# Patient Record
Sex: Female | Born: 1965 | Race: White | Hispanic: No | Smoking: Former smoker
Health system: Southern US, Community
[De-identification: ages and names within clinical notes are randomized; demographics above are authoritative.]

## PROBLEM LIST (undated history)

## (undated) DIAGNOSIS — M722 Plantar fascial fibromatosis: Secondary | ICD-10-CM

## (undated) DIAGNOSIS — E785 Hyperlipidemia, unspecified: Secondary | ICD-10-CM

## (undated) HISTORY — PX: TUBAL LIGATION: SHX77

---

## 2001-01-25 ENCOUNTER — Other Ambulatory Visit: Admission: RE | Admit: 2001-01-25 | Discharge: 2001-01-25 | Payer: Self-pay | Admitting: Family Medicine

## 2005-02-13 ENCOUNTER — Ambulatory Visit: Payer: Self-pay

## 2005-08-11 ENCOUNTER — Ambulatory Visit: Payer: Self-pay

## 2006-08-07 ENCOUNTER — Ambulatory Visit: Payer: Self-pay | Admitting: Internal Medicine

## 2007-01-28 ENCOUNTER — Ambulatory Visit: Payer: Self-pay | Admitting: Internal Medicine

## 2008-01-31 ENCOUNTER — Ambulatory Visit: Payer: Self-pay | Admitting: Internal Medicine

## 2009-02-01 ENCOUNTER — Ambulatory Visit: Payer: Self-pay | Admitting: Internal Medicine

## 2010-04-24 ENCOUNTER — Ambulatory Visit: Payer: Self-pay

## 2011-12-11 IMAGING — MG MAM DGTL SCREENING MAMMO W/CAD
1 series · 4 of 4 positions shown · non-contrast
Comparison: none

REASON FOR EXAM: SCR
COMMENTS:

PROCEDURE:     MAM - MAM DGTL SCREENING MAMMO W/CAD  - April 24, 2010  [DATE]
RESULT:       Comparison is made to a prior study dated 02/01/2009 and
12/13/2002.
The breasts demonstrate a heterogenous parenchymal pattern.  There is no new
mammographic evidence to suggest malignancy.

[R CC · right · 4 of 4 slices shown]
[im 1/4]
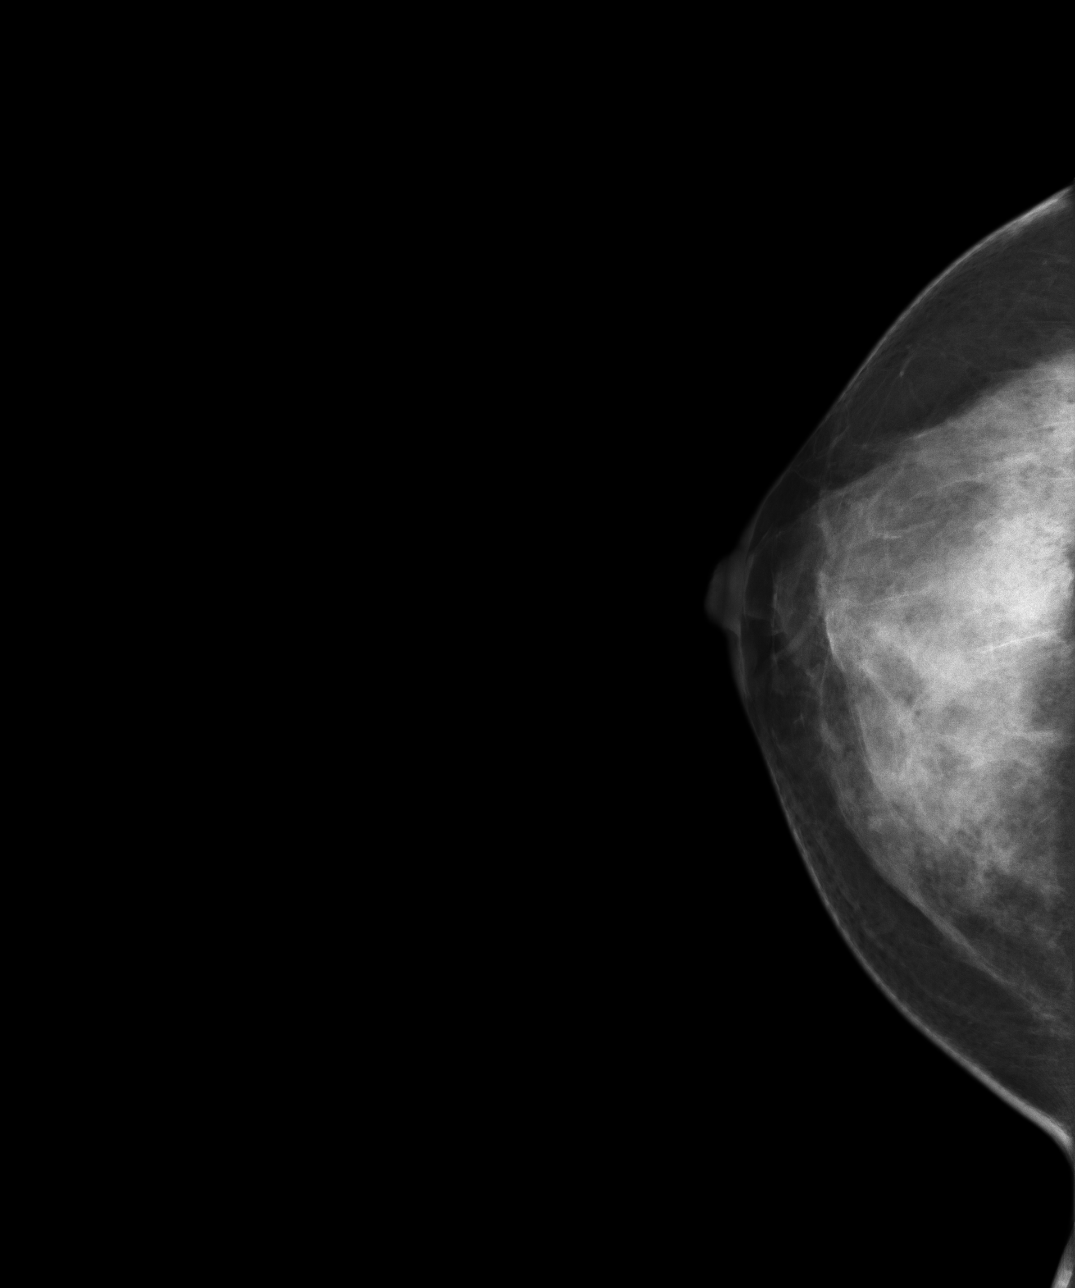
[im 2/4]
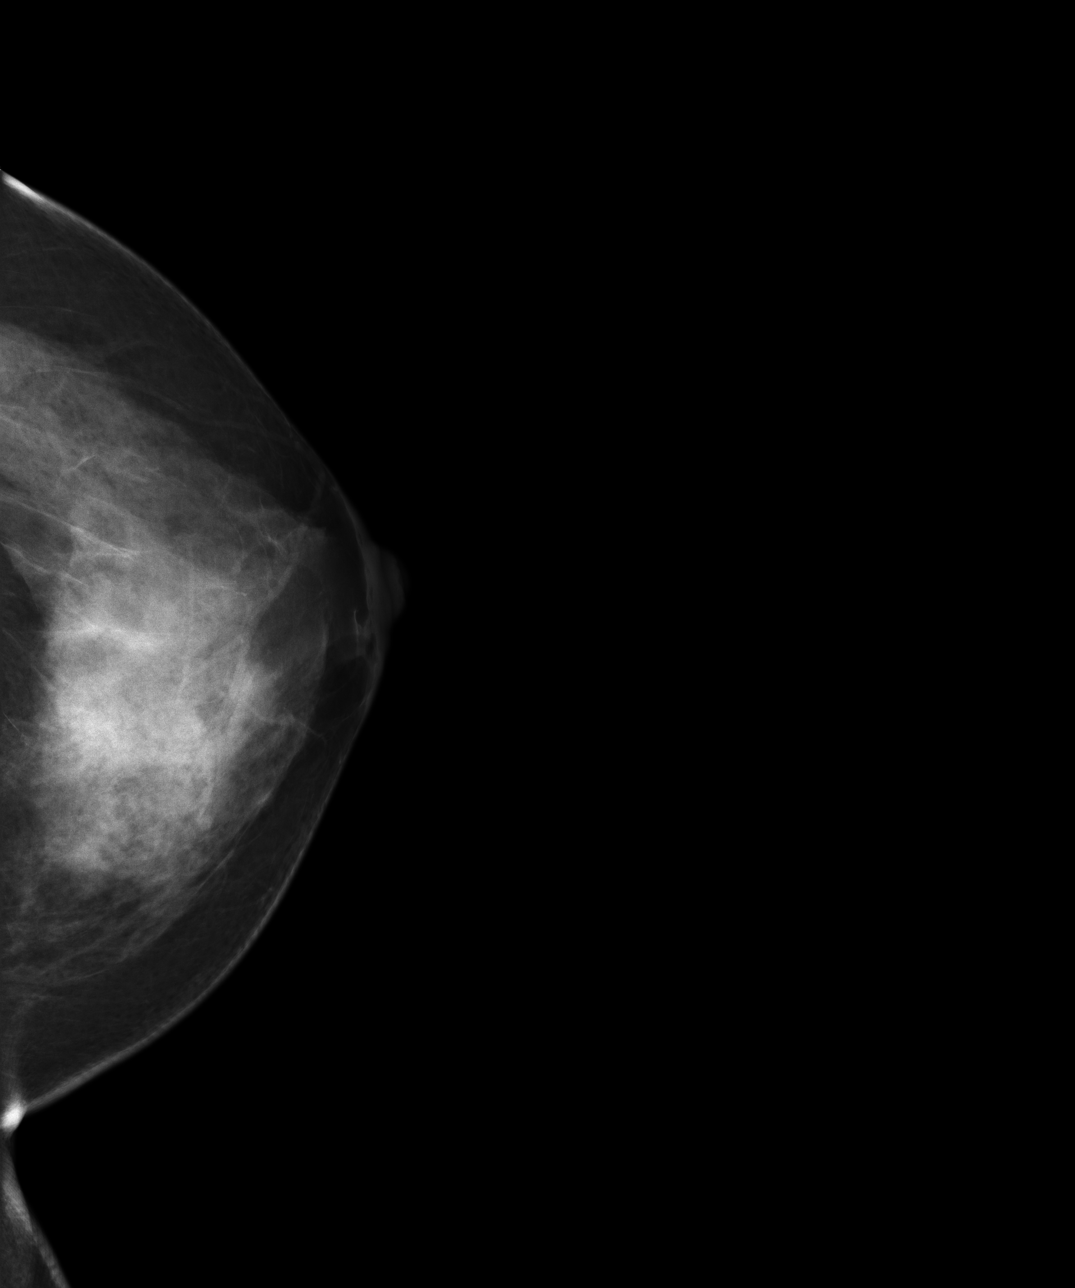
[im 3/4]
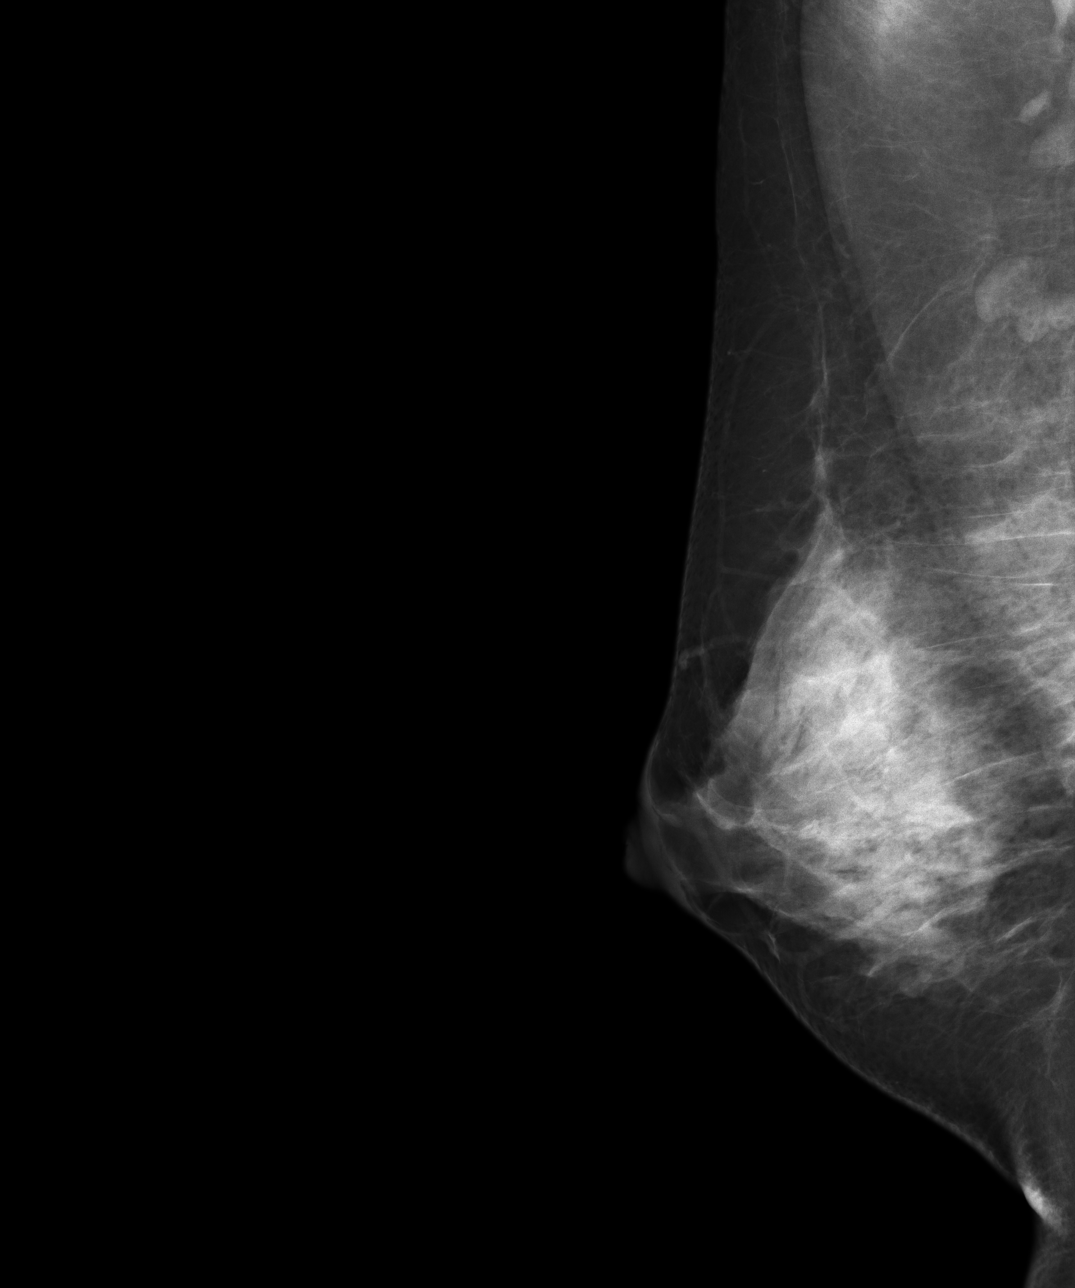
[im 4/4]
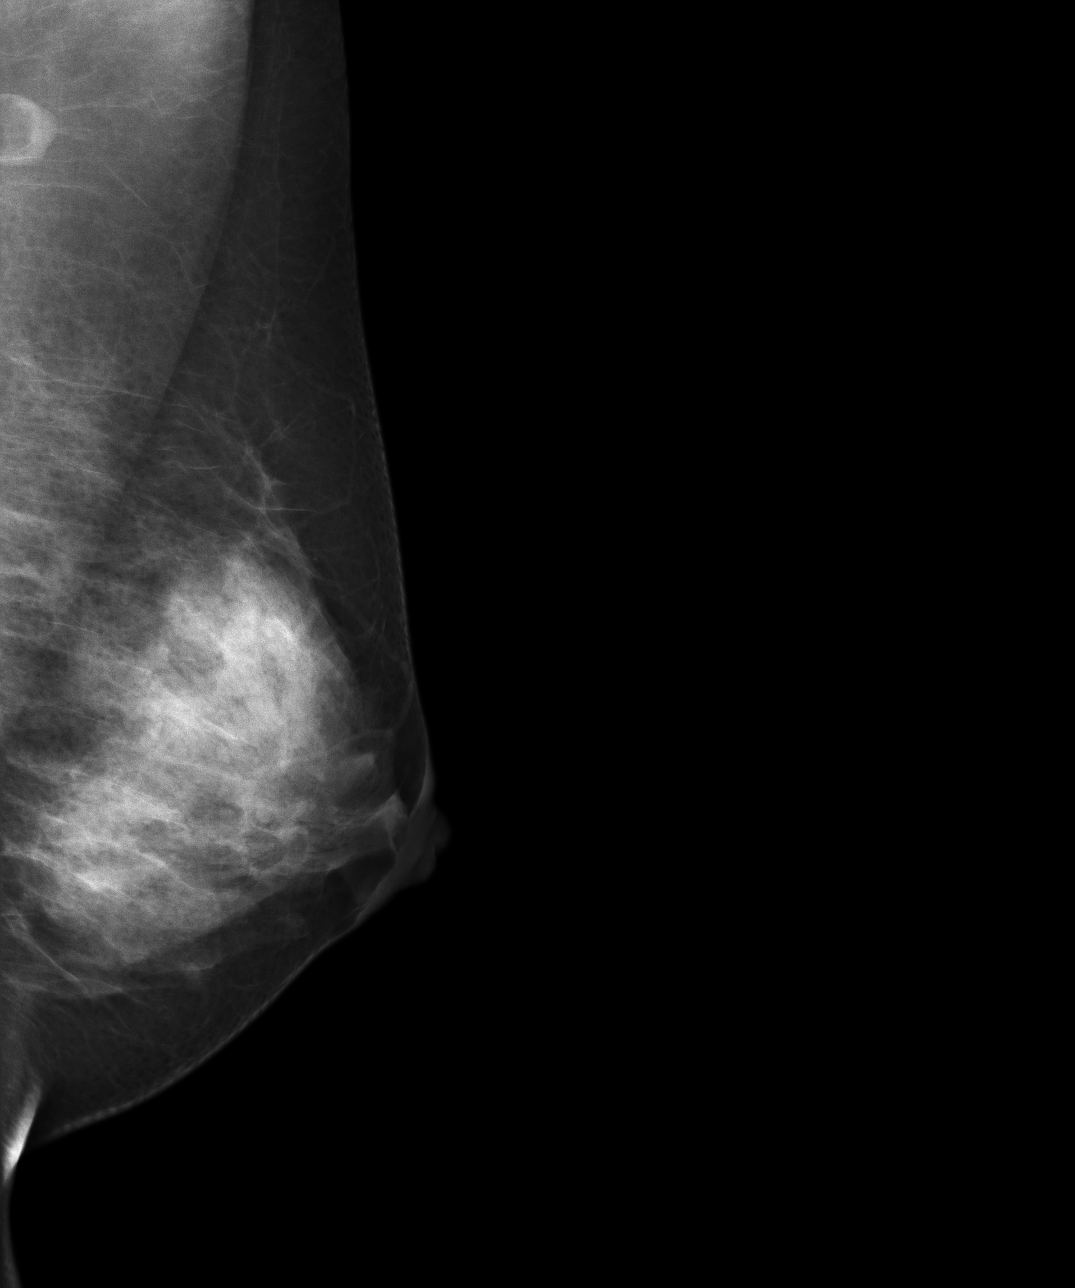

[4 of 4 positions shown; findings below may reference images not displayed]

IMPRESSION: BI-RADS:  Category 2- Benign Finding.

A negative mammogram report does not preclude biopsy or other evaluation of
a clinically palpable or otherwise suspicious mass or lesion. Breast cancer
may not be detected by mammography in up to 10% of cases.

## 2011-12-16 ENCOUNTER — Ambulatory Visit: Payer: Self-pay

## 2013-02-03 ENCOUNTER — Other Ambulatory Visit: Payer: Self-pay

## 2013-02-03 LAB — CBC WITH DIFFERENTIAL/PLATELET
Basophil #: 0 10*3/uL (ref 0.0–0.1)
Eosinophil #: 0.1 10*3/uL (ref 0.0–0.7)
Eosinophil %: 1.9 %
HCT: 39.2 % (ref 35.0–47.0)
Lymphocyte #: 1.6 10*3/uL (ref 1.0–3.6)
Lymphocyte %: 30.3 %
MCH: 29.9 pg (ref 26.0–34.0)
MCHC: 34.1 g/dL (ref 32.0–36.0)
MCV: 88 fL (ref 80–100)
Monocyte #: 0.5 x10 3/mm (ref 0.2–0.9)
Monocyte %: 8.5 %
Platelet: 279 10*3/uL (ref 150–440)
RBC: 4.47 10*6/uL (ref 3.80–5.20)
RDW: 12.7 % (ref 11.5–14.5)
WBC: 5.4 10*3/uL (ref 3.6–11.0)

## 2013-02-03 LAB — COMPREHENSIVE METABOLIC PANEL
Albumin: 4.2 g/dL (ref 3.4–5.0)
Alkaline Phosphatase: 98 U/L (ref 50–136)
Anion Gap: 4 — ABNORMAL LOW (ref 7–16)
Bilirubin,Total: 0.4 mg/dL (ref 0.2–1.0)
Calcium, Total: 9.1 mg/dL (ref 8.5–10.1)
Co2: 30 mmol/L (ref 21–32)
EGFR (African American): >60
EGFR (Non-African Amer.): >60
Glucose: 90 mg/dL (ref 65–99)
Osmolality: 275 (ref 275–301)
Potassium: 3.8 mmol/L (ref 3.5–5.1)
SGOT(AST): 24 U/L (ref 15–37)
Sodium: 138 mmol/L (ref 136–145)
Total Protein: 8 g/dL (ref 6.4–8.2)

## 2013-02-03 LAB — LIPID PANEL
Cholesterol: 244 mg/dL — ABNORMAL HIGH (ref 0–200)
Ldl Cholesterol, Calc: 148 mg/dL — ABNORMAL HIGH (ref 0–100)

## 2013-02-15 ENCOUNTER — Ambulatory Visit: Payer: Self-pay

## 2014-10-04 IMAGING — MG MAM DGTL SCRN MAM NO ORDER W/CAD
1 series · 4 of 4 positions shown · non-contrast
Comparison: Previous exam(s).

REASON FOR EXAM: SCR MAMMO NO ORDER
COMMENTS:

PROCEDURE:     MAM - MAM DGTL SCRN MAM NO ORDER W/CAD  - February 15, 2013  [DATE]
CLINICAL DATA: Screening.
EXAM:
DIGITAL SCREENING BILATERAL MAMMOGRAM WITH CAD

[R CC · right · 4 of 4 slices shown]
[im 1/4]
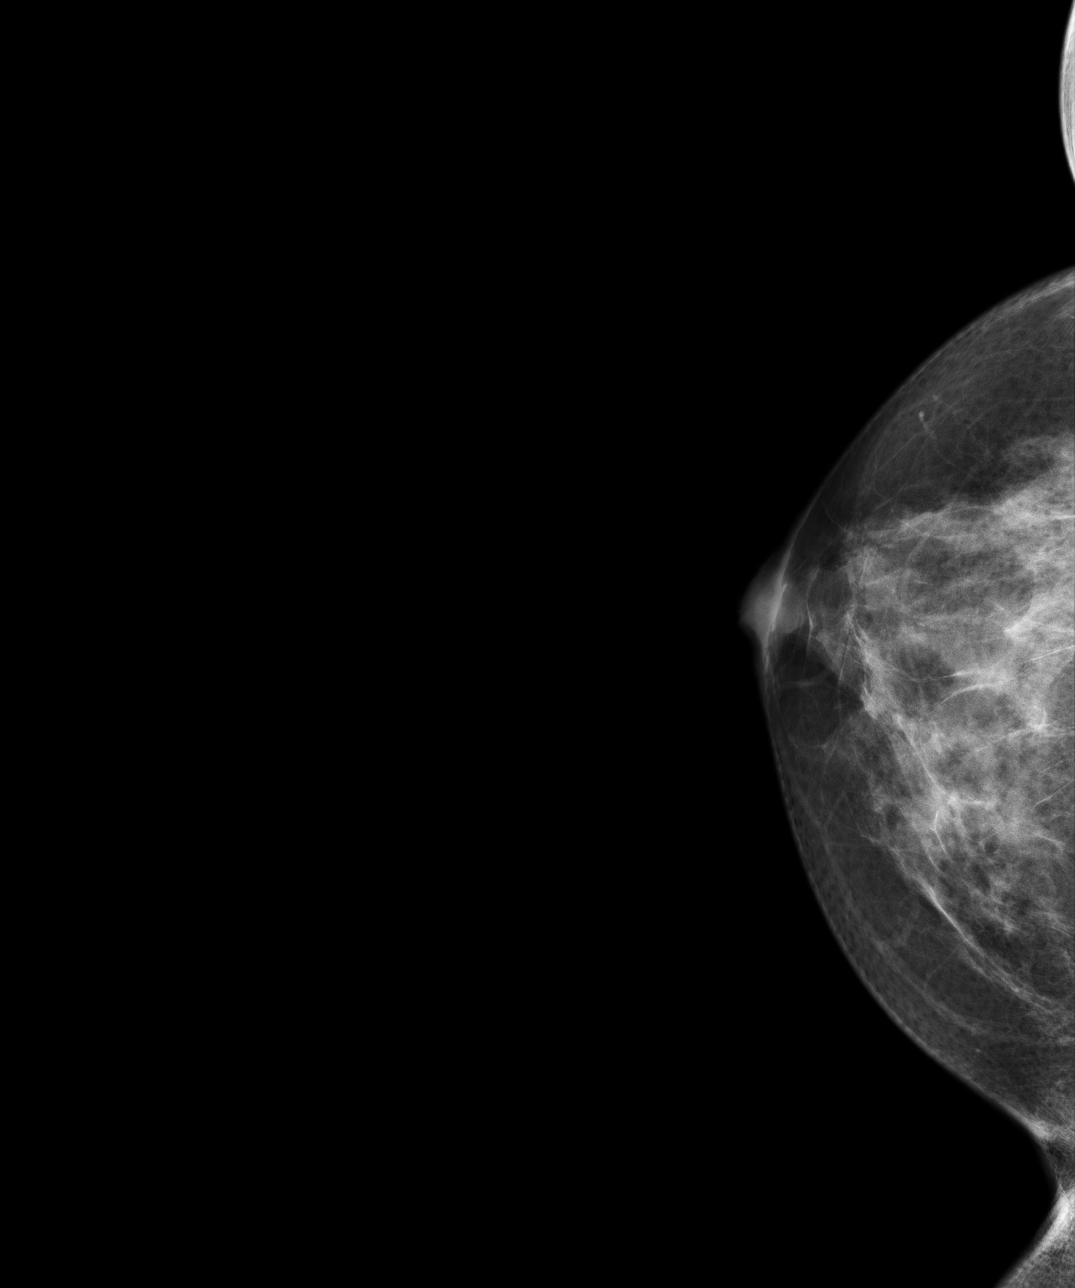
[im 2/4]
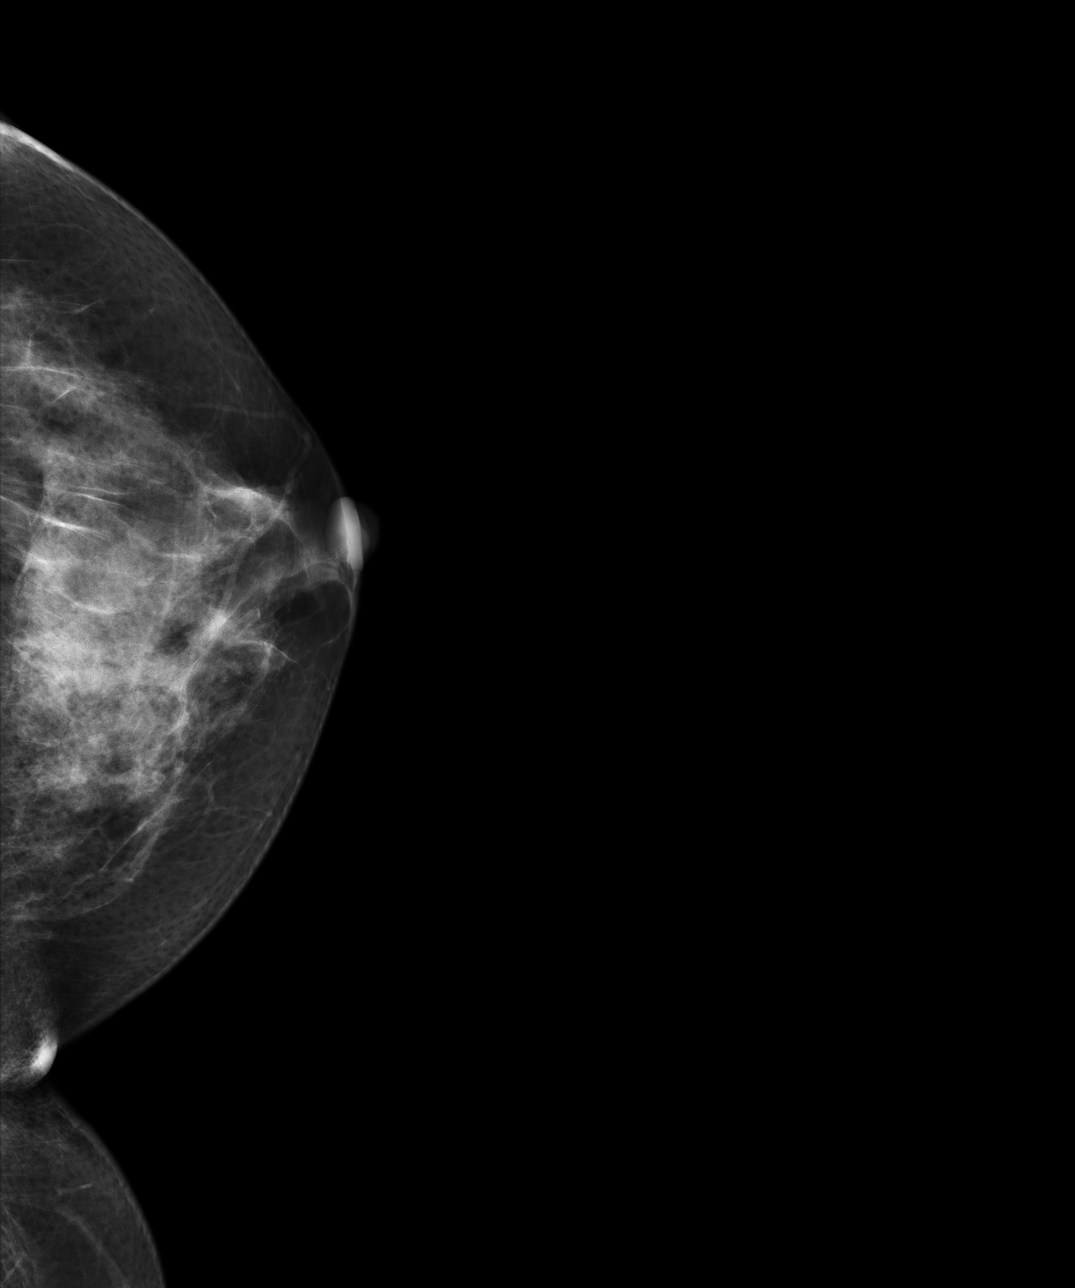
[im 3/4]
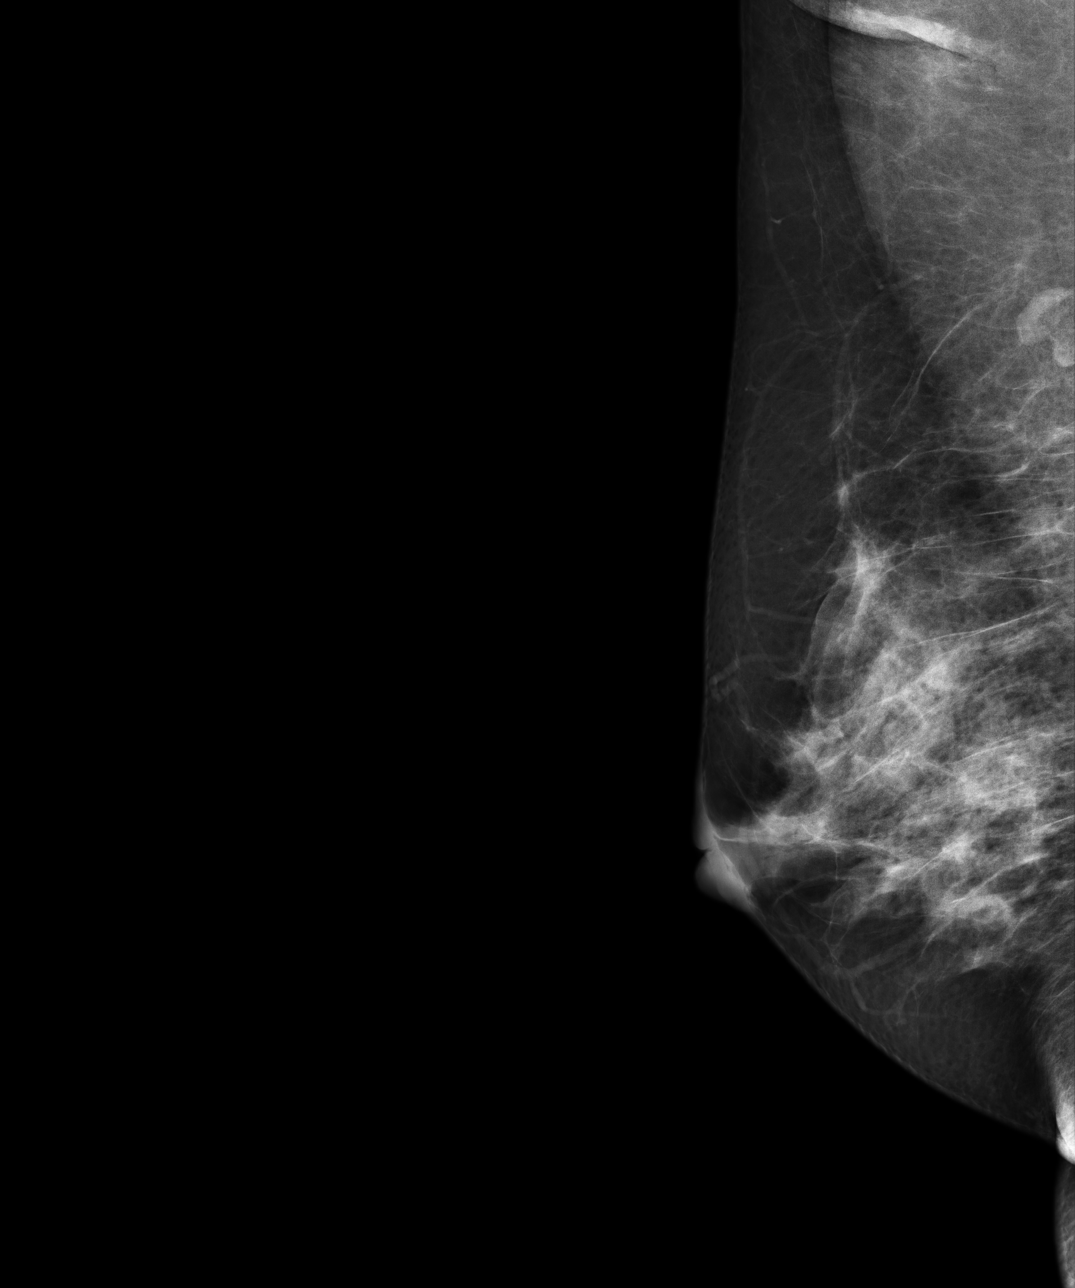
[im 4/4]
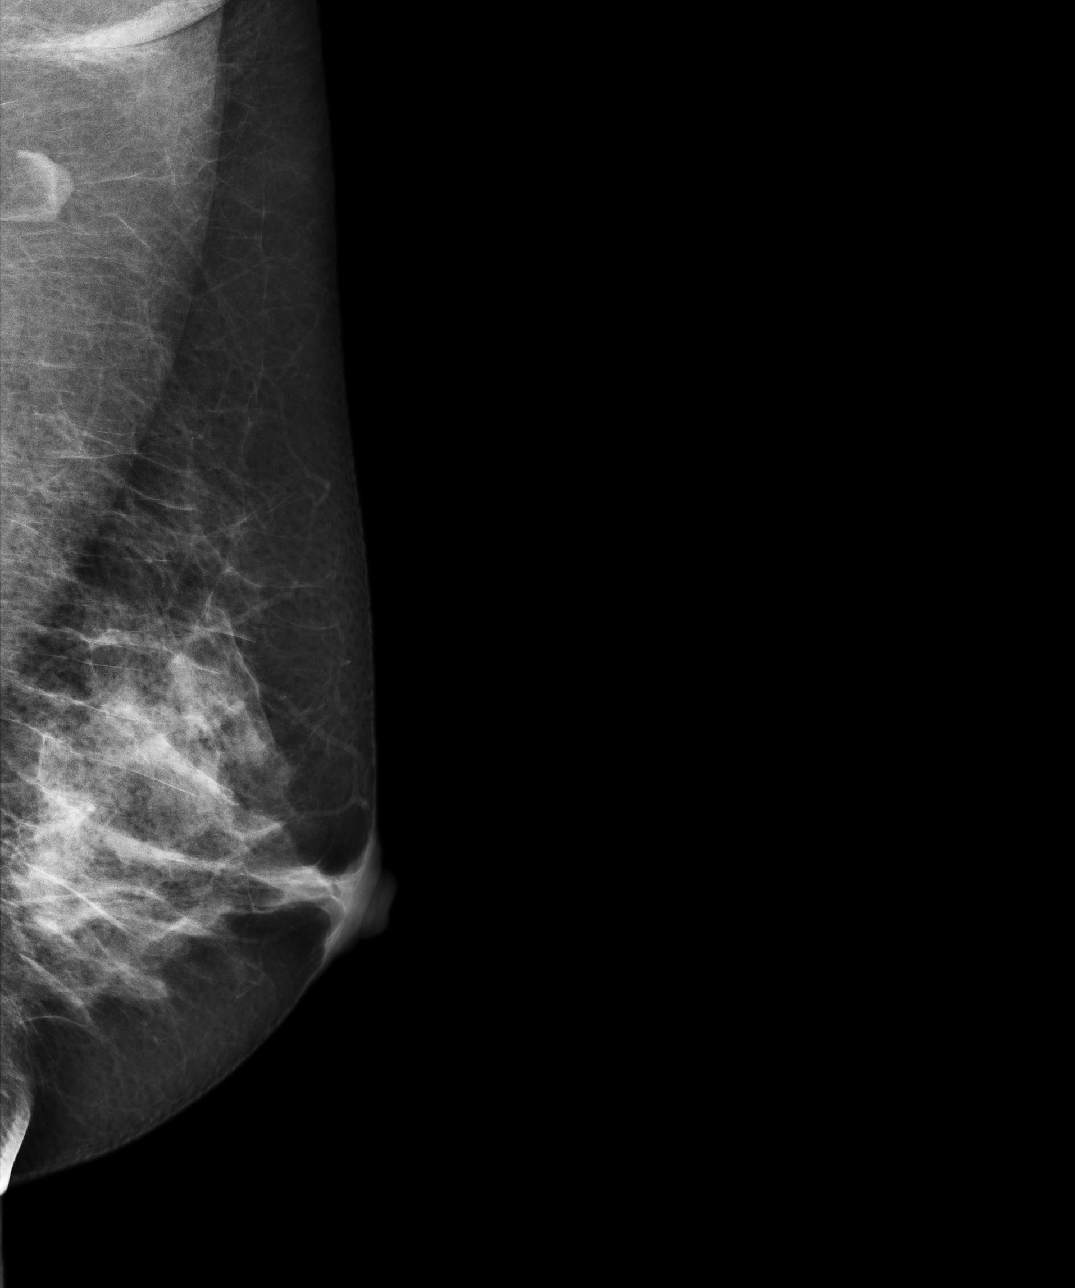

[4 of 4 positions shown; findings below may reference images not displayed]

ACR Breast Density Category c: The breasts are heterogeneously
dense, which may obscure small masses.
FINDINGS: There are no findings suspicious for malignancy. Images were
processed with CAD.
IMPRESSION: No mammographic evidence of malignancy. A result letter of this
screening mammogram will be mailed directly to the patient.

RECOMMENDATION:
Screening mammogram in one year. (Code:JR-3-VIF)

BI-RADS CATEGORY  1: Negative

## 2016-10-07 ENCOUNTER — Encounter: Payer: Self-pay | Admitting: Podiatry

## 2016-10-07 ENCOUNTER — Ambulatory Visit (INDEPENDENT_AMBULATORY_CARE_PROVIDER_SITE_OTHER): Payer: Managed Care, Other (non HMO)

## 2016-10-07 ENCOUNTER — Ambulatory Visit (INDEPENDENT_AMBULATORY_CARE_PROVIDER_SITE_OTHER): Payer: Managed Care, Other (non HMO) | Admitting: Podiatry

## 2016-10-07 DIAGNOSIS — M659 Synovitis and tenosynovitis, unspecified: Secondary | ICD-10-CM

## 2016-10-07 DIAGNOSIS — M79673 Pain in unspecified foot: Secondary | ICD-10-CM | POA: Diagnosis not present

## 2016-10-08 NOTE — Progress Notes (Signed)
   Subjective:  Patient presents today for aching, throbbing pain and tenderness to the medial side of the right ankle for the past 6 months. 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 Patient presents for further treatment and evaluation.   Objective / Physical Exam:  General:  The patient is alert and oriented x3 in no acute distress. Dermatology:  Skin is warm, dry and supple bilateral lower extremities. Negative for open lesions or macerations. Vascular:  Palpable pedal pulses bilaterally. No edema or erythema noted. Capillary refill within normal limits. Neurological:  Epicritic and protective threshold grossly intact bilaterally.  Musculoskeletal Exam:  Pain on palpation to the anterior lateral medial aspects of the patient's right ankle. Mild edema noted.  Range of motion within normal limits to all pedal and ankle joints bilateral. Muscle strength 5/5 in all groups bilateral.   Radiographic Exam:  Normal osseous mineralization. Joint spaces preserved. No fracture/dislocation/boney destruction.    Assessment: #1 pain in right ankle #2 synovitis of right ankle   Plan of Care:  #1 Patient was evaluated. X-rays reviewed. #2 injection of 0.5 mL Celestone Soluspan injected in the patient's right ankle. #3 right ankle brace dispensed. #4 continue taking OTC Motrin when necessary. #5 return to clinic in 4 weeks.  Felecia ShellingBrent M. Loys Shugars, DPM [facility]  Dr. Felecia ShellingBrent M. Dandrea Widdowson, DPM    [address]                                        [place], [place] [address]                Office [phone]  Fax [phone]

## 2016-10-25 MED ORDER — BETAMETHASONE SOD PHOS & ACET 6 (3-3) MG/ML IJ SUSP: 3.0000 mg | Freq: Once | INTRAMUSCULAR | Status: DC

## 2016-11-07 ENCOUNTER — Ambulatory Visit (INDEPENDENT_AMBULATORY_CARE_PROVIDER_SITE_OTHER): Payer: Managed Care, Other (non HMO) | Admitting: Podiatry

## 2016-11-07 DIAGNOSIS — M659 Synovitis and tenosynovitis, unspecified: Secondary | ICD-10-CM

## 2016-11-11 NOTE — Progress Notes (Signed)
   Subjective:  Patient presents today for follow up evaluation of right ankle pain. She states she is doing a lot better and is only having mild pain. Wearing the ankle brace and receiving the injection has helped alleviate the pain. She denies any new complaints at this time.    Objective / Physical Exam:  General:  The patient is alert and oriented x3 in no acute distress. Dermatology:  Skin is warm, dry and supple bilateral lower extremities. Negative for open lesions or macerations. Vascular:  Palpable pedal pulses bilaterally. No edema or erythema noted. Capillary refill within normal limits. Neurological:  Epicritic and protective threshold grossly intact bilaterally.  Musculoskeletal Exam:  Pain on palpation to the anterior lateral medial aspects of the patient's right ankle. Mild edema noted.  Range of motion within normal limits to all pedal and ankle joints bilateral. Muscle strength 5/5 in all groups bilateral.    Assessment: #1 pain in right ankle #2 synovitis of right ankle   Plan of Care:  #1 Patient was evaluated. #2 injection of 0.5 mL Celestone Soluspan injected in the patient's right ankle. #3 Continue wearing ankle brace and good shoe gear.  #4 Continue taking Motrin as needed. #5 Return to clinic when necessary.   Felecia ShellingBrent M. Bonnita Newby, DPM [facility]  Dr. Felecia ShellingBrent M. Wolfgang Finigan, DPM    [address]                                        [place], [place] [address]                Office [phone]  Fax [phone]

## 2016-11-14 MED ORDER — BETAMETHASONE SOD PHOS & ACET 6 (3-3) MG/ML IJ SUSP: 3.0000 mg | Freq: Once | INTRAMUSCULAR | Status: DC

## 2017-07-09 ENCOUNTER — Other Ambulatory Visit: Payer: Self-pay | Admitting: Family Medicine

## 2017-07-09 DIAGNOSIS — Z1231 Encounter for screening mammogram for malignant neoplasm of breast: Secondary | ICD-10-CM

## 2017-08-05 ENCOUNTER — Encounter: Payer: Self-pay | Admitting: Radiology

## 2017-08-05 ENCOUNTER — Ambulatory Visit
Admission: RE | Admit: 2017-08-05 | Discharge: 2017-08-05 | Disposition: A | Discharge status: Home / Self Care | Payer: Managed Care, Other (non HMO) | Source: Ambulatory Visit | Attending: Family Medicine | Admitting: Family Medicine

## 2017-08-05 DIAGNOSIS — Z1231 Encounter for screening mammogram for malignant neoplasm of breast: Secondary | ICD-10-CM | POA: Insufficient documentation

## 2017-09-07 LAB — HM COLONOSCOPY

## 2017-12-25 ENCOUNTER — Encounter: Payer: Self-pay | Admitting: Podiatry

## 2017-12-25 ENCOUNTER — Ambulatory Visit (INDEPENDENT_AMBULATORY_CARE_PROVIDER_SITE_OTHER): Payer: Managed Care, Other (non HMO)

## 2017-12-25 ENCOUNTER — Ambulatory Visit (INDEPENDENT_AMBULATORY_CARE_PROVIDER_SITE_OTHER): Payer: Managed Care, Other (non HMO) | Admitting: Podiatry

## 2017-12-25 DIAGNOSIS — M722 Plantar fascial fibromatosis: Secondary | ICD-10-CM

## 2017-12-25 MED ORDER — MELOXICAM 15 MG PO TABS: 15.0000 mg | ORAL_TABLET | Freq: Every day | ORAL | 1 refills | Status: AC

## 2017-12-25 MED ORDER — METHYLPREDNISOLONE 4 MG PO TBPK: ORAL_TABLET | ORAL | 0 refills | Status: DC

## 2017-12-27 NOTE — Progress Notes (Signed)
   Subjective: 52 year old female presenting today with a chief complaint of sharp, stabbing pain noted to the bilateral heels, left worse than right, that began about 6 months ago. She states it feels as if she is walking on rocks. She states the pain is worse after being on her feet all day at work and when she first gets out of the bed in the morning. She has been using inserts, stretching, icing and taking Ibuprofen, all with no significant relief. Patient presents today for further treatment and evaluation.  No past medical history on file.   Objective: Physical Exam General: The patient is alert and oriented x3 in no acute distress.  Dermatology: Skin is warm, dry and supple bilateral lower extremities. Negative for open lesions or macerations bilateral.   Vascular: Dorsalis Pedis and Posterior Tibial pulses palpable bilateral.  Capillary fill time is immediate to all digits.  Neurological: Epicritic and protective threshold intact bilateral.   Musculoskeletal: Tenderness to palpation to the plantar aspect of the bilateral heels along the plantar fascia. All other joints range of motion within normal limits bilateral. Strength 5/5 in all groups bilateral.   Radiographic exam: Normal osseous mineralization. Joint spaces preserved. No fracture/dislocation/boney destruction. No other soft tissue abnormalities or radiopaque foreign bodies.   Assessment: 1. plantar fasciitis bilateral feet 2. Right ankle synovitis - resolved   Plan of Care:  1. Patient evaluated. Xrays reviewed.   2. Injection of 0.5cc Celestone soluspan injected into the bilateral heels.  3. Rx for Medrol Dose Pak placed 4. Rx for Meloxicam ordered for patient. 5. Plantar fascial band(s) dispensed for bilateral plantar fasciitis. 6. Instructed patient regarding therapies and modalities at home to alleviate symptoms.  7. Return to clinic in 4 weeks.    Felecia ShellingBrent M. Evans, DPM [facility]  Dr. Felecia ShellingBrent M.  Evans, DPM    [address]                                   [place], [place] [address]                Office [phone]  Fax [phone]

## 2018-01-22 ENCOUNTER — Encounter

## 2018-01-22 ENCOUNTER — Ambulatory Visit (INDEPENDENT_AMBULATORY_CARE_PROVIDER_SITE_OTHER): Payer: Managed Care, Other (non HMO) | Admitting: Podiatry

## 2018-01-22 DIAGNOSIS — M722 Plantar fascial fibromatosis: Secondary | ICD-10-CM | POA: Diagnosis not present

## 2018-01-25 NOTE — Progress Notes (Signed)
   Subjective: 52 year old female presenting today for follow up evaluation of plantar fasciitis of bilateral feet. She reports the pain has improved but is still present when working. She has been wearing the fascial braces and taking Meloxicam for treatment. Patient is here for further evaluation and treatment.   No past medical history on file.   Objective: Physical Exam General: The patient is alert and oriented x3 in no acute distress.  Dermatology: Skin is warm, dry and supple bilateral lower extremities. Negative for open lesions or macerations bilateral.   Vascular: Dorsalis Pedis and Posterior Tibial pulses palpable bilateral.  Capillary fill time is immediate to all digits.  Neurological: Epicritic and protective threshold intact bilateral.   Musculoskeletal: Tenderness to palpation to the plantar aspect of the bilateral heels along the plantar fascia. All other joints range of motion within normal limits bilateral. Strength 5/5 in all groups bilateral.   Assessment: 1. plantar fasciitis bilateral feet  Plan of Care:  1. Patient evaluated.    2. Injection of 0.5cc Celestone soluspan injected into the bilateral heels.  3. Continue using plantar fascial braces and taking Meloxicam.  4. Continue using insoles from chiropractor.  5. Return to clinic as needed.   Works at [employer].    Felecia ShellingBrent M. Adriaan Maltese, DPM [facility]  Dr. Felecia ShellingBrent M. Zerick Prevette, DPM    [address]                                   [place], [place] [address]                Office [phone]  Fax [phone]

## 2018-07-14 ENCOUNTER — Encounter: Payer: Self-pay | Admitting: Emergency Medicine

## 2018-07-14 ENCOUNTER — Other Ambulatory Visit: Payer: Self-pay

## 2018-07-14 ENCOUNTER — Ambulatory Visit (INDEPENDENT_AMBULATORY_CARE_PROVIDER_SITE_OTHER): Payer: Worker's Compensation

## 2018-07-14 ENCOUNTER — Ambulatory Visit
Admission: EM | Admit: 2018-07-14 | Discharge: 2018-07-14 | Disposition: A | Discharge status: Home / Self Care | Payer: Worker's Compensation | Attending: Internal Medicine | Admitting: Internal Medicine

## 2018-07-14 DIAGNOSIS — M79642 Pain in left hand: Secondary | ICD-10-CM | POA: Diagnosis not present

## 2018-07-14 DIAGNOSIS — Z042 Encounter for examination and observation following work accident: Secondary | ICD-10-CM

## 2018-07-14 HISTORY — DX: Plantar fascial fibromatosis: M72.2

## 2018-07-14 NOTE — ED Provider Notes (Signed)
[facility]    CSN: [id number] Arrival date & time: 07/14/18  0945     History   Chief Complaint Chief Complaint  Patient presents with  . Hand Injury    W/C DOI 07/13/18 left    HPI Diana Rose is a 53 y.o. female.   This is a 53 year old female presents to care complaining of left forearm and hand pain.  The patient states that she was at work and lifting parts off an assembly line when she felt a sharp pain between her second and third digit that ran up her lateral forearm.  The sharp pain has subsided but she still has soreness and swelling of her forearm.  The interdigital space of her left hand is also tender to palpation.  She denies weakness or numbness in the hand.     Past Medical History:  Diagnosis Date  . Plantar fasciitis     There are no active problems to display for this patient.   Past Surgical History:  Procedure Laterality Date  . TUBAL LIGATION      OB History   No obstetric history on file.      Home Medications    Prior to Admission medications   Medication Sig Start Date End Date Taking? Authorizing Provider  escitalopram (LEXAPRO) 10 MG tablet  12/24/17   [provider]  methylPREDNISolone (MEDROL DOSEPAK) 4 MG TBPK tablet 6 day dose pack - take as directed 12/25/17   Felecia Shelling, DPM  Omega 3 1000 MG CAPS Take by mouth.    [provider]    Family History Family History  Problem Relation Age of Onset  . Fibromyalgia Mother   . Heart attack Mother        25s  . Hypertension Mother   . Other Father        unknown medical history    Social History Social History   Tobacco Use  . Smoking status: Current Some Day Smoker  . Smokeless tobacco: Never Used  Substance Use Topics  . Alcohol use: Yes    Comment: occassional  . Drug use: Never     Allergies   Asa [aspirin]   Review of Systems Review of Systems  Constitutional: Negative for chills and fever.  HENT: Negative for sore  throat and tinnitus.   Eyes: Negative for redness.  Respiratory: Negative for cough and shortness of breath.   Cardiovascular: Negative for chest pain and palpitations.  Gastrointestinal: Negative for abdominal pain, diarrhea, nausea and vomiting.  Genitourinary: Negative for dysuria, frequency and urgency.  Musculoskeletal: Negative for myalgias.  Skin: Negative for rash.       No lesions  Neurological: Negative for weakness.  Hematological: Does not bruise/bleed easily.  Psychiatric/Behavioral: Negative for suicidal ideas.     Physical Exam Triage Vital Signs ED Triage Vitals  Enc Vitals Group     BP 07/14/18 1017 128/82     Pulse Rate 07/14/18 1017 67     Resp 07/14/18 1017 16     Temp 07/14/18 1017 (!) 97.5 F (36.4 C)     Temp Source 07/14/18 1017 Oral     SpO2 07/14/18 1017 98 %     Weight 07/14/18 1017 160 lb (72.6 kg)     Height 07/14/18 1017 5' (1.524 m)     Head Circumference --      Peak Flow --      Pain Score 07/14/18 1016 4     Pain Loc --  Pain Edu? --      Excl. in GC? --    No data found.  Updated Vital Signs BP 128/82 (BP Location: Right Arm)   Pulse 67   Temp (!) 97.5 F (36.4 C) (Oral)   Resp 16   Ht 5' (1.524 m)   Wt 72.6 kg   SpO2 98%   BMI 31.25 kg/m   Visual Acuity Right Eye Distance:   Left Eye Distance:   Bilateral Distance:    Right Eye Near:   Left Eye Near:    Bilateral Near:     Physical Exam Vitals signs and nursing note reviewed.  Constitutional:      General: She is not in acute distress.    Appearance: She is well-developed.  HENT:     Head: Normocephalic and atraumatic.  Eyes:     General: No scleral icterus.    Conjunctiva/sclera: Conjunctivae normal.     Pupils: Pupils are equal, round, and reactive to light.  Neck:     Musculoskeletal: Normal range of motion and neck supple.     Thyroid: No thyromegaly.     Vascular: No JVD.     Trachea: No tracheal deviation.  Cardiovascular:     Rate and Rhythm:  Normal rate and regular rhythm.     Heart sounds: Normal heart sounds. No murmur. No friction rub. No gallop.   Pulmonary:     Effort: Pulmonary effort is normal.     Breath sounds: Normal breath sounds.  Abdominal:     General: Bowel sounds are normal. There is no distension.     Palpations: Abdomen is soft.     Tenderness: There is no abdominal tenderness.  Musculoskeletal: Normal range of motion.     Comments: Mild swelling of left forearm.  No bony abnormalities of the hand.  Grip strength normal.  Normal extensor range of motion. normal sensation  Lymphadenopathy:     Cervical: No cervical adenopathy.  Skin:    General: Skin is warm and dry.  Neurological:     Mental Status: She is alert and oriented to person, place, and time.     Cranial Nerves: No cranial nerve deficit.  Psychiatric:        Behavior: Behavior normal.        Thought Content: Thought content normal.        Judgment: Judgment normal.      UC Treatments / Results  Labs (all labs ordered are listed, but only abnormal results are displayed) Labs Reviewed - No data to display  EKG None  Radiology Dg Hand Complete Left  Result Date: 07/14/2018 CLINICAL DATA:  Pain between the second and third digits after repetitive injury last week. EXAM: LEFT HAND - COMPLETE 3+ VIEW COMPARISON:  None. FINDINGS: Generalized osteopenic appearance. No evidence of fracture or malalignment. No erosion or spurring. Lucency in the third proximal phalanx on the oblique view with benign appearance. IMPRESSION: No acute finding. Electronically Signed   By: Marnee SpringJonathon  Watts M.D.   On: 07/14/2018 11:10    Procedures Procedures (including critical care time)  Medications Ordered in UC Medications - No data to display  Initial Impression / Assessment and Plan / UC Course  I have reviewed the triage vital signs and the nursing notes.  Pertinent labs & imaging results that were available during my care of the patient were reviewed  by me and considered in my medical decision making (see chart for details).     X-ray negative for  fracture.  Seems more consistent with tenosynovitis.  Noninfectious.  Possibly avulsion of extensor tendon loops.  No compromise of range of motion or grip strength.  Arm splinted with IV board and Ace wrap to prevent hyperextension of fingers.  If forearm/brachioradialis soreness persists advised tendinitis band over forearm.  NSAID therapy and rest.  Worker's Compensation form completed.  Final Clinical Impressions(s) / UC Diagnoses   Final diagnoses:  Left hand pain   Discharge Instructions   None    ED Prescriptions    None     Controlled Substance Prescriptions [place] Controlled Substance Registry consulted? Not Applicable   Arnaldo Natal, MD 07/14/18 1139

## 2018-07-14 NOTE — ED Triage Notes (Signed)
Patient in today c/o left hand injury at work on 07/13/18. Patient states she was during her normal job and suddenly felt numbness in her left hand.

## 2019-07-13 DIAGNOSIS — G4726 Circadian rhythm sleep disorder, shift work type: Secondary | ICD-10-CM | POA: Diagnosis not present

## 2019-07-13 DIAGNOSIS — G4733 Obstructive sleep apnea (adult) (pediatric): Secondary | ICD-10-CM | POA: Diagnosis not present

## 2019-07-13 DIAGNOSIS — E669 Obesity, unspecified: Secondary | ICD-10-CM | POA: Diagnosis not present

## 2019-08-05 ENCOUNTER — Ambulatory Visit: Payer: Self-pay | Attending: Internal Medicine

## 2019-08-05 DIAGNOSIS — Z23 Encounter for immunization: Secondary | ICD-10-CM

## 2019-08-05 NOTE — Progress Notes (Signed)
   Covid-19 Vaccination Clinic  Name:  Diana Rose    MRN: [id number] DOB: 10/17/1965  08/05/2019  Diana Rose was observed post Covid-19 immunization for 15 minutes without incident. She was provided with Vaccine Information Sheet and instruction to access the V-Safe system.   Diana Rose was instructed to call 911 with any severe reactions post vaccine: Marland Kitchen Difficulty breathing  . Swelling of face and throat  . A fast heartbeat  . A bad rash all over body  . Dizziness and weakness   Immunizations Administered    Name Date Dose VIS Date Route   Pfizer COVID-19 Vaccine 08/05/2019  8:35 AM 0.3 mL 04/15/2019 Intramuscular   Manufacturer: [employer], [employer]   Lot: [id number]   NDC: [address]-8

## 2019-09-06 ENCOUNTER — Ambulatory Visit: Payer: Self-pay | Attending: Internal Medicine

## 2019-09-06 DIAGNOSIS — Z23 Encounter for immunization: Secondary | ICD-10-CM

## 2019-09-06 NOTE — Progress Notes (Signed)
   Covid-19 Vaccination Clinic  Name:  Diana Rose    MRN: [id number] DOB: 11/12/1965  09/06/2019  [name] was observed post Covid-19 immunization for 15 minutes without incident. She was provided with Vaccine Information Sheet and instruction to access the V-Safe system.   [name] was instructed to call 911 with any severe reactions post vaccine: Marland Kitchen Difficulty breathing  . Swelling of face and throat  . A fast heartbeat  . A bad rash all over body  . Dizziness and weakness   Immunizations Administered    Name Date Dose VIS Date Route   Pfizer COVID-19 Vaccine 09/06/2019  8:34 AM 0.3 mL 06/29/2018 Intramuscular   Manufacturer: [employer], [employer]   Lot: [id number]   NDC: [address]-4

## 2020-03-01 IMAGING — CR LEFT HAND - COMPLETE 3+ VIEW
3 series · 3 of 3 positions shown · non-contrast
Comparison: None.

CLINICAL DATA: Pain between the second and third digits after
repetitive injury last week.

EXAM:
LEFT HAND - COMPLETE 3+ VIEW

[hand ap]
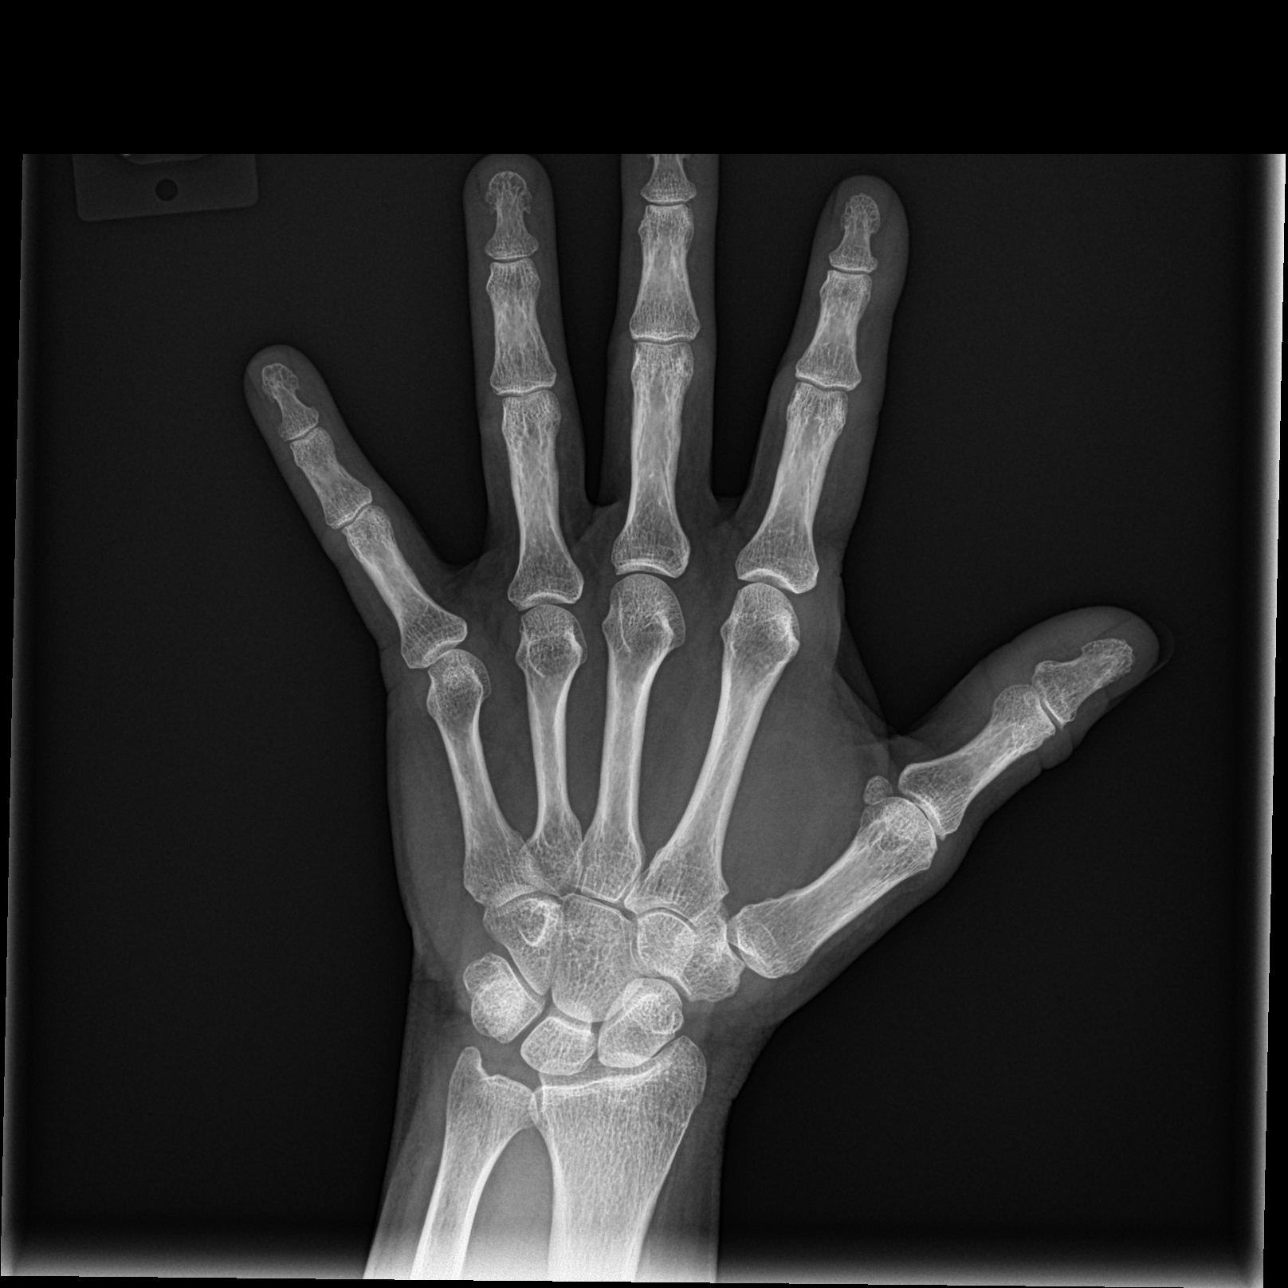

[hand obl]
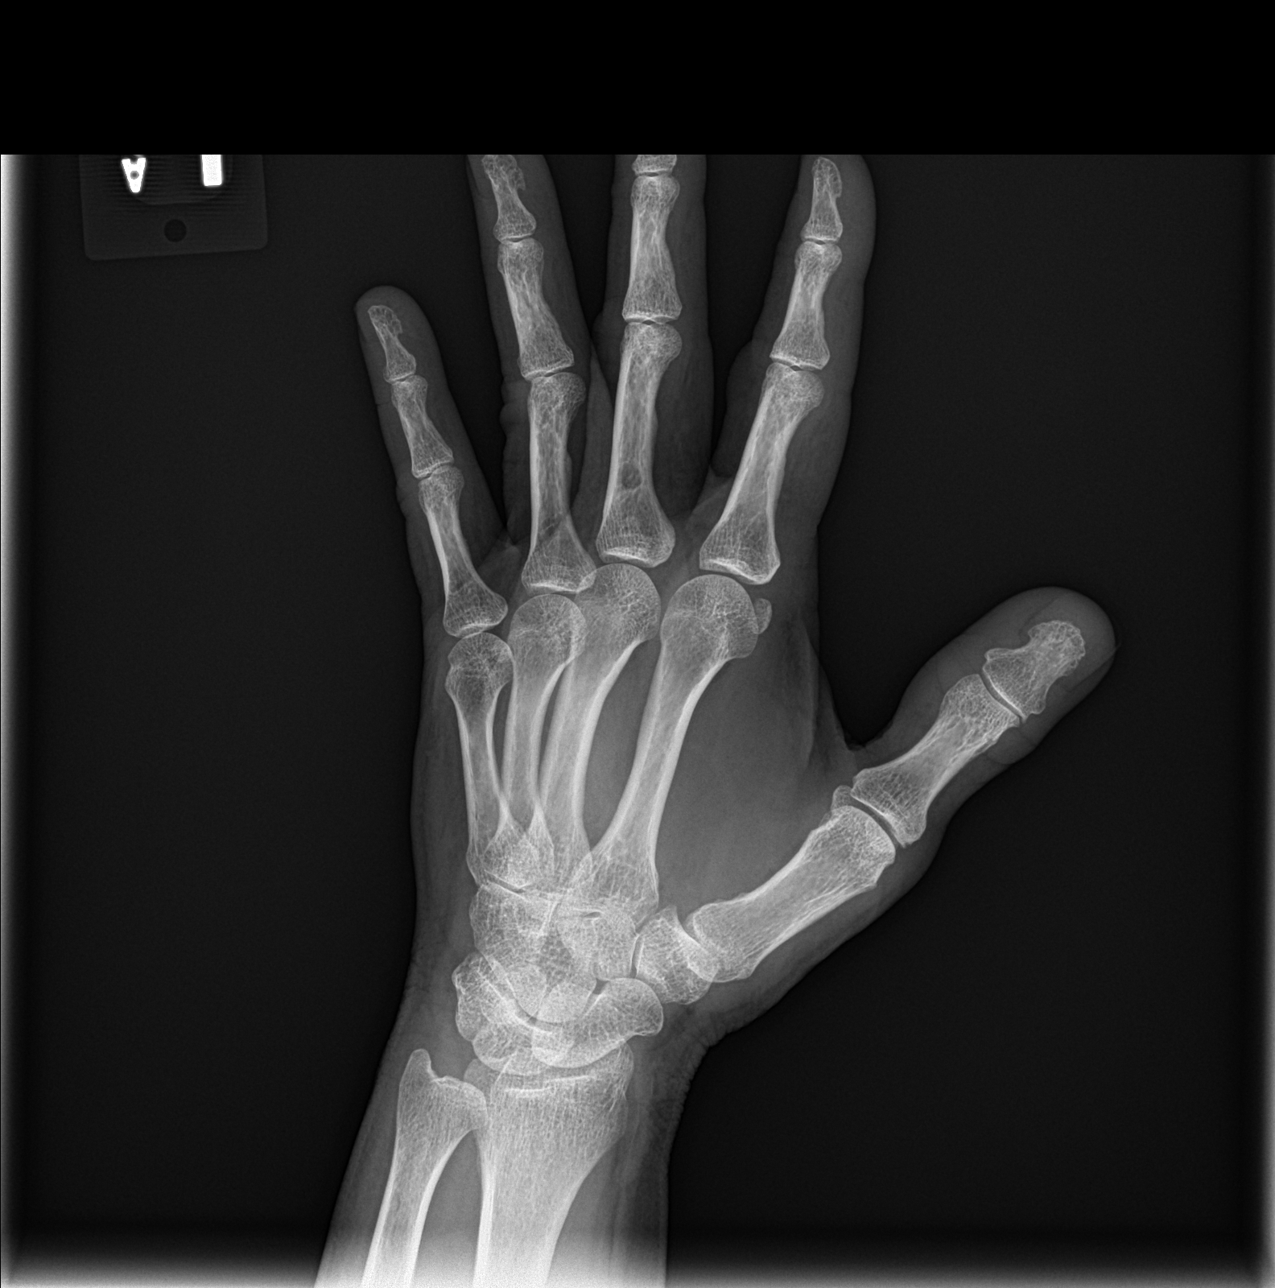

[hand lat]
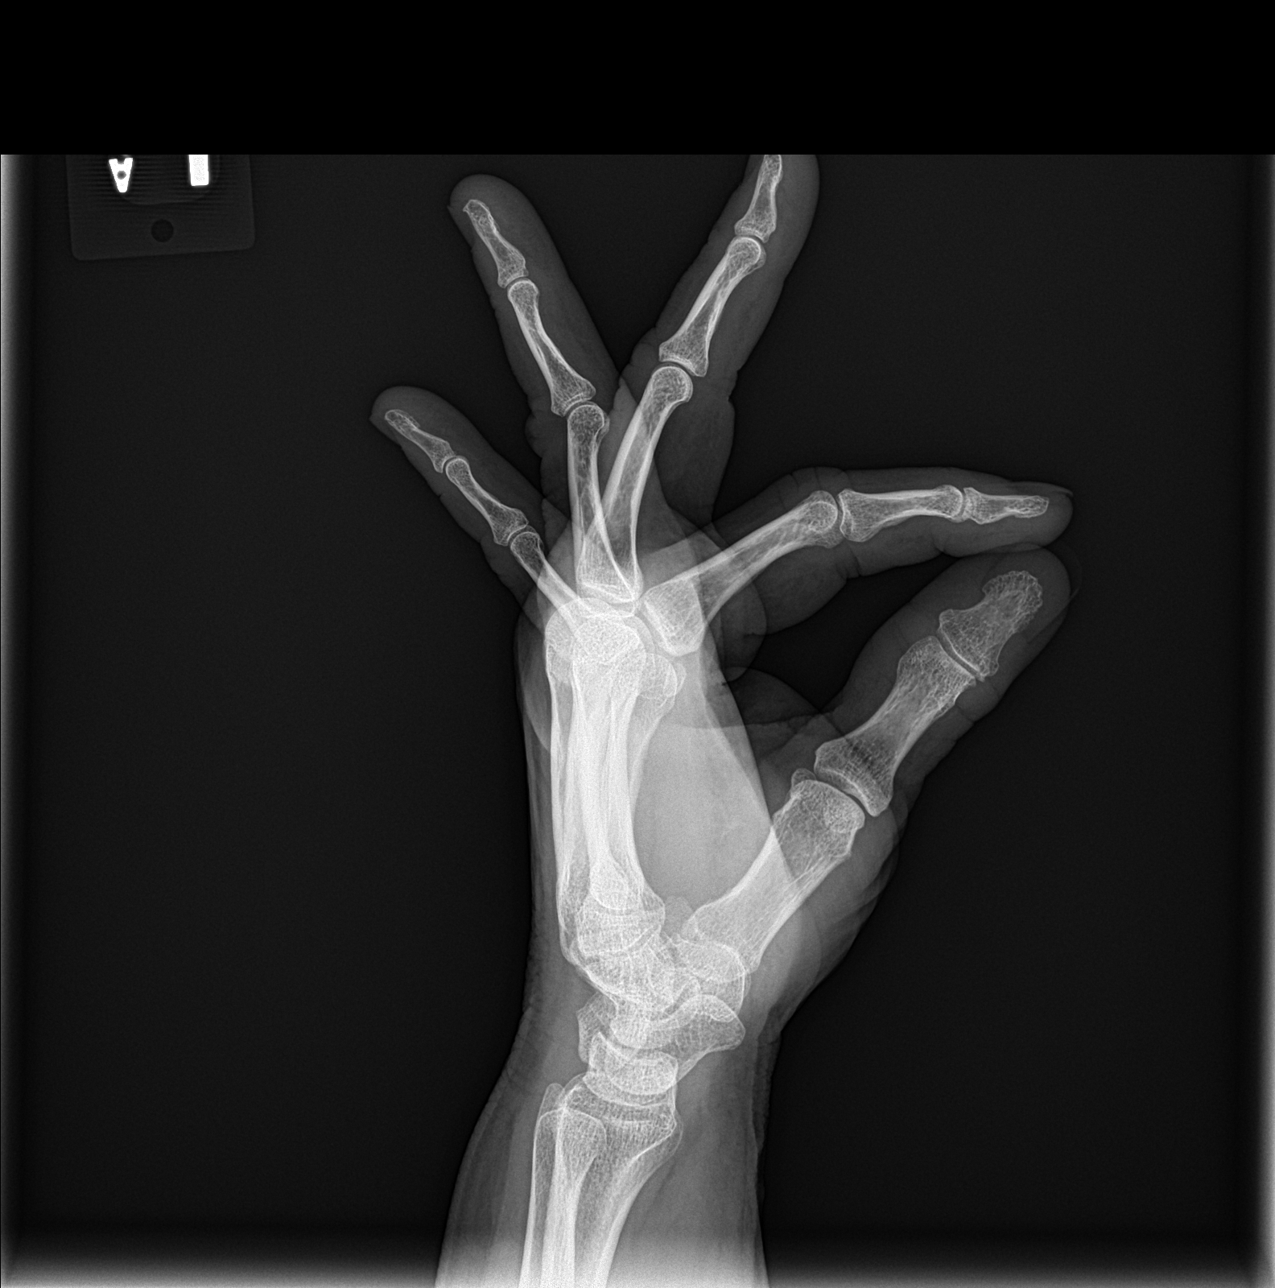

[3 of 3 positions shown; findings below may reference images not displayed]

FINDINGS: Generalized osteopenic appearance. No evidence of fracture or
malalignment. No erosion or spurring. Lucency in the third proximal
phalanx on the oblique view with benign appearance.
IMPRESSION: No acute finding.

## 2020-07-09 ENCOUNTER — Ambulatory Visit (INDEPENDENT_AMBULATORY_CARE_PROVIDER_SITE_OTHER): Payer: BC Managed Care – PPO | Admitting: Internal Medicine

## 2020-07-09 VITALS — BP 117/76 | HR 76 | Temp 98.8°F | Resp 16 | Ht 60.0 in | Wt 148.0 lb

## 2020-07-09 DIAGNOSIS — G4733 Obstructive sleep apnea (adult) (pediatric): Secondary | ICD-10-CM

## 2020-07-09 DIAGNOSIS — Z7189 Other specified counseling: Secondary | ICD-10-CM | POA: Insufficient documentation

## 2020-07-09 DIAGNOSIS — Z9989 Dependence on other enabling machines and devices: Secondary | ICD-10-CM

## 2020-07-09 NOTE — Patient Instructions (Signed)

## 2020-07-09 NOTE — Progress Notes (Signed)
[facility] [address] [place], [place] [address]  Pulmonary Sleep Medicine   Office Visit Note  Patient Name: Diana Rose DOB: November 14, 1965 MRN [id number]    Chief Complaint: Obstructive Sleep Apnea visit  Brief History:  Dillan is seen today for follow up The patient has a 1.5 year history of sleep apnea. Patient is not using PAP nightly. 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  She is getting 5 hours during the work week but will sleep longer on days off. She was working 7 nights for a while.  The patient feels more rested after sleeping with PAP.  The patient reports benefiting from PAP use. Reported sleepiness is  Improved on CPAP and the Epworth Sleepiness Score is 4 out of 24. The patient will take naps. The patient complains of the following: no complaint  The compliance download shows poor compliance with an average use time of 5 hours. The AHI is 2.2  The patient complains of limb movements disrupting sleep.  ROS  General: (-) fever, (-) chills, (-) night sweat Nose and Sinuses: (-) nasal stuffiness or itchiness, (-) postnasal drip, (-) nosebleeds, (-) sinus trouble. Mouth and Throat: (-) sore throat, (-) hoarseness. Neck: (-) swollen glands, (-) enlarged thyroid, (-) neck pain. Respiratory: - cough, - shortness of breath, - wheezing. Neurologic: - numbness, - tingling. Psychiatric: - anxiety, - depression   Current Medication: Outpatient Encounter Medications as of 07/09/2020  Medication Sig  . methylPREDNISolone (MEDROL DOSEPAK) 4 MG TBPK tablet 6 day dose pack - take as directed  . Omega 3 1000 MG CAPS Take by mouth.  . [DISCONTINUED] escitalopram (LEXAPRO) 10 MG tablet    Facility-Administered Encounter Medications as of 07/09/2020  Medication  . betamethasone acetate-betamethasone sodium phosphate (CELESTONE) injection 3 mg  . betamethasone acetate-betamethasone sodium phosphate (CELESTONE) injection  3 mg    Surgical History: Past Surgical History:  Procedure Laterality Date  . TUBAL LIGATION      Medical History: Past Medical History:  Diagnosis Date  . Plantar fasciitis     Family History: Non contributory to the present illness  Social History: Social History   Socioeconomic History  . Marital status: Married    Spouse name: Not on file  . Number of children: Not on file  . Years of education: Not on file  . Highest education level: Not on file  Occupational History  . Not on file  Tobacco Use  . Smoking status: Current Some Day Smoker  . Smokeless tobacco: Never Used  Vaping Use  . Vaping Use: Never used  Substance and Sexual Activity  . Alcohol use: Yes    Comment: occassional  . Drug use: Never  . Sexual activity: Not on file  Other Topics Concern  . Not on file  Social History Narrative  . Not on file   Social Determinants of Health   Financial Resource Strain: Not on file  Food Insecurity: Not on file  Transportation Needs: Not on file  Physical Activity: Not on file  Stress: Not on file  Social Connections: Not on file  Intimate Partner Violence: Not on file    Vital Signs: Blood pressure 117/76, pulse 76, temperature 98.8 F (37.1 C), temperature source Temporal, resp. rate 16, height 5' (1.524 m), weight 148 lb (67.1 kg), SpO2 97 %.  Examination: General Appearance: The patient is well-developed, well-nourished, and in no distress. Neck Circumference: 34 Skin: Gross inspection of  skin unremarkable. Head: normocephalic, no gross deformities. Eyes: no gross deformities noted. ENT: ears appear grossly normal Neurologic: Alert and oriented. No involuntary movements.    EPWORTH SLEEPINESS SCALE:  Scale:  (0)= no chance of dozing; (1)= slight chance of dozing; (2)= moderate chance of dozing; (3)= high chance of dozing  Chance  Situtation    Sitting and reading: 1    Watching TV: 1    Sitting Inactive in public: 0    As a  passenger in car: 0      Lying down to rest: 2    Sitting and talking: 0    Sitting quielty after lunch: 0    In a car, stopped in traffic: 0   TOTAL SCORE:   4 out of 24    SLEEP STUDIES:  1. PSG 07/25/18 -AHI 83.7, Low SpO2 58%   CPAP COMPLIANCE DATA:  Date Range: 07/05/19 - 07/03/20  Average Daily Use: 3:53 hours  Median Use: 4:44  Compliance for > 4 Hours: 51%  AHI: 2.2 respiratory events per hour  Days Used: 287/365  Mask Leak: 6.4 lpm  95th Percentile Pressure: 9 cmh2o         LABS: No results found for this or any previous visit (from the past 2160 hour(s)).  Radiology: DG Hand Complete Left  Result Date: 07/14/2018 CLINICAL DATA:  Pain between the second and third digits after repetitive injury last week. EXAM: LEFT HAND - COMPLETE 3+ VIEW COMPARISON:  None. FINDINGS: Generalized osteopenic appearance. No evidence of fracture or malalignment. No erosion or spurring. Lucency in the third proximal phalanx on the oblique view with benign appearance. IMPRESSION: No acute finding. Electronically Signed   By: Marnee Spring M.D.   On: 07/14/2018 11:10    No results found.  No results found.    Assessment and Plan: Patient Active Problem List   Diagnosis Date Noted  . OSA on CPAP 07/09/2020  . CPAP use counseling 07/09/2020   1. OSA on CPAP The patient does tolerate PAP and reports significan benefit from PAP use. He main problem is that her sleep time is limited by shift work and driving her nephew. The patient is caring for her machine as recommended and advised to increase time in bed. The patient was also counselled on the importance of adequate sleep time for overall health. The compliance is poor. The apnea is controlled. OSA- continue nightly use of CPAP and ensure adequate sleep.    2. CPAP use counseling CPAP Counseling: had a lengthy discussion with the patient regarding the importance of PAP therapy in management of the sleep apnea.  Patient appears to understand the risk factor reduction and also understands the risks associated with untreated sleep apnea. Patient will try to make a good faith effort to remain compliant with therapy. Also instructed the patient on proper cleaning of the device including the water must be changed daily if possible and use of distilled water is preferred. Patient understands that the machine should be regularly cleaned with appropriate recommended cleaning solutions that do not damage the PAP machine for example given white vinegar and water rinses. Other methods such as ozone treatment may not be as good as these simple methods to achieve cleaning.   General Counseling: I have discussed the findings of the evaluation and examination with Bonita Quin.  I have also discussed any further diagnostic evaluation thatmay be needed or ordered today. Kynsli verbalizes understanding of the findings of todays visit. We also reviewed her medications  today and discussed drug interactions and side effects including but not limited excessive drowsiness and altered mental states. We also discussed that there is always a risk not just to her but also people around her. she has been encouraged to call the office with any questions or concerns that should arise related to todays visit.  No orders of the defined types were placed in this encounter.       I have personally obtained a history, examined the patient, evaluated laboratory and imaging results, formulated the assessment and plan and placed orders.   This patient was seen today by Emmaline Kluver, PA-C in collaboration with Dr. Freda Munro.  Valentino Hue Sol Blazing, PhD, FAASM  Diplomate, American Board of Sleep Medicine    Yevonne Pax, MD [facility] Diplomate ABMS Pulmonary and Critical Care Medicine Sleep medicine

## 2020-07-13 ENCOUNTER — Other Ambulatory Visit: Payer: Self-pay | Admitting: Family Medicine

## 2020-07-13 DIAGNOSIS — Z1231 Encounter for screening mammogram for malignant neoplasm of breast: Secondary | ICD-10-CM

## 2021-06-17 ENCOUNTER — Other Ambulatory Visit: Payer: Self-pay

## 2021-06-17 ENCOUNTER — Encounter: Payer: Self-pay | Admitting: Internal Medicine

## 2021-06-17 ENCOUNTER — Ambulatory Visit: Payer: BC Managed Care – PPO | Admitting: Internal Medicine

## 2021-06-17 VITALS — BP 134/80 | HR 64 | Temp 97.3°F | Ht 60.0 in | Wt 170.0 lb

## 2021-06-17 DIAGNOSIS — G4733 Obstructive sleep apnea (adult) (pediatric): Secondary | ICD-10-CM

## 2021-06-17 DIAGNOSIS — L308 Other specified dermatitis: Secondary | ICD-10-CM | POA: Diagnosis not present

## 2021-06-17 DIAGNOSIS — E782 Mixed hyperlipidemia: Secondary | ICD-10-CM

## 2021-06-17 DIAGNOSIS — Z9989 Dependence on other enabling machines and devices: Secondary | ICD-10-CM

## 2021-06-17 DIAGNOSIS — Z6833 Body mass index (BMI) 33.0-33.9, adult: Secondary | ICD-10-CM

## 2021-06-17 DIAGNOSIS — E6609 Other obesity due to excess calories: Secondary | ICD-10-CM | POA: Insufficient documentation

## 2021-06-17 DIAGNOSIS — L309 Dermatitis, unspecified: Secondary | ICD-10-CM | POA: Insufficient documentation

## 2021-06-17 DIAGNOSIS — R7309 Other abnormal glucose: Secondary | ICD-10-CM | POA: Diagnosis not present

## 2021-06-17 MED ORDER — FLUOCINOLONE ACETONIDE 0.01 % EX SOLN: Freq: Every day | CUTANEOUS | 1 refills | Status: DC

## 2021-06-17 NOTE — Assessment & Plan Note (Signed)
C-Met, lipid and A1c today °Encouraged her to consume a low-fat diet °

## 2021-06-17 NOTE — Assessment & Plan Note (Signed)
Encourage weight loss as this can help reduce sleep apnea symptoms Encourage CPAP compliance 

## 2021-06-17 NOTE — Assessment & Plan Note (Signed)
Encourage diet and exercise for weight loss 

## 2021-06-17 NOTE — Patient Instructions (Signed)
Heart-Healthy Eating Plan Heart-healthy meal planning includes: Eating less unhealthy fats. Eating more healthy fats. Making other changes in your diet. Talk with your doctor or a diet specialist (dietitian) to create an eating plan that is right for you. What is my plan? Your doctor may recommend an eating plan that includes: Total fat: ______% or less of total calories a day. Saturated fat: ______% or less of total calories a day. Cholesterol: less than _________mg a day. What are tips for following this plan? Cooking Avoid frying your food. Try to bake, boil, grill, or broil it instead. You can also reduce fat by: Removing the skin from poultry. Removing all visible fats from meats. Steaming vegetables in water or broth. Meal planning  At meals, divide your plate into four equal parts: Fill one-half of your plate with vegetables and green salads. Fill one-fourth of your plate with whole grains. Fill one-fourth of your plate with lean protein foods. Eat 4-5 servings of vegetables per day. A serving of vegetables is: 1 cup of raw or cooked vegetables. 2 cups of raw leafy greens. Eat 4-5 servings of fruit per day. A serving of fruit is: 1 medium whole fruit.  cup of dried fruit.  cup of fresh, frozen, or canned fruit.  cup of 100% fruit juice. Eat more foods that have soluble fiber. These are apples, broccoli, carrots, beans, peas, and barley. Try to get 20-30 g of fiber per day. Eat 4-5 servings of nuts, legumes, and seeds per week: 1 serving of dried beans or legumes equals  cup after being cooked. 1 serving of nuts is  cup. 1 serving of seeds equals 1 tablespoon. General information Eat more home-cooked food. Eat less restaurant, buffet, and fast food. Limit or avoid alcohol. Limit foods that are high in starch and sugar. Avoid fried foods. Lose weight if you are overweight. Keep track of how much salt (sodium) you eat. This is important if you have high blood  pressure. Ask your doctor to tell you more about this. Try to add vegetarian meals each week. Fats Choose healthy fats. These include olive oil and canola oil, flaxseeds, walnuts, almonds, and seeds. Eat more omega-3 fats. These include salmon, mackerel, sardines, tuna, flaxseed oil, and ground flaxseeds. Try to eat fish at least 2 times each week. Check food labels. Avoid foods with trans fats or high amounts of saturated fat. Limit saturated fats. These are often found in animal products, such as meats, butter, and cream. These are also found in plant foods, such as palm oil, palm kernel oil, and coconut oil. Avoid foods with partially hydrogenated oils in them. These have trans fats. Examples are stick margarine, some tub margarines, cookies, crackers, and other baked goods. What foods can I eat? Fruits All fresh, canned (in natural juice), or frozen fruits. Vegetables Fresh or frozen vegetables (raw, steamed, roasted, or grilled). Green salads. Grains Most grains. Choose whole wheat and whole grains most of the time. Rice and pasta, including brown rice and pastas made with whole wheat. Meats and other proteins Lean, well-trimmed beef, veal, pork, and lamb. Chicken and turkey without skin. All fish and shellfish. Wild duck, rabbit, pheasant, and venison. Egg whites or low-cholesterol egg substitutes. Dried beans, peas, lentils, and tofu. Seeds and most nuts. Dairy Low-fat or nonfat cheeses, including ricotta and mozzarella. Skim or 1% milk that is liquid, powdered, or evaporated. Buttermilk that is made with low-fat milk. Nonfat or low-fat yogurt. Fats and oils Non-hydrogenated (trans-free) margarines. Vegetable oils, including   soybean, sesame, sunflower, olive, peanut, safflower, corn, canola, and cottonseed. Salad dressings or mayonnaise made with a vegetable oil. Beverages Mineral water. Coffee and tea. Diet carbonated beverages. Sweets and desserts Sherbet, gelatin, and fruit ice.  Small amounts of dark chocolate. Limit all sweets and desserts. Seasonings and condiments All seasonings and condiments. The items listed above may not be a complete list of foods and drinks you can eat. Contact a dietitian for more options. What foods should I avoid? Fruits Canned fruit in heavy syrup. Fruit in cream or butter sauce. Fried fruit. Limit coconut. Vegetables Vegetables cooked in cheese, cream, or butter sauce. Fried vegetables. Grains Breads that are made with saturated or trans fats, oils, or whole milk. Croissants. Sweet rolls. Donuts. High-fat crackers, such as cheese crackers. Meats and other proteins Fatty meats, such as hot dogs, ribs, sausage, bacon, rib-eye roast or steak. High-fat deli meats, such as salami and bologna. Caviar. Domestic duck and goose. Organ meats, such as liver. Dairy Cream, sour cream, cream cheese, and creamed cottage cheese. Whole-milk cheeses. Whole or 2% milk that is liquid, evaporated, or condensed. Whole buttermilk. Cream sauce or high-fat cheese sauce. Yogurt that is made from whole milk. Fats and oils Meat fat, or shortening. Cocoa butter, hydrogenated oils, palm oil, coconut oil, palm kernel oil. Solid fats and shortenings, including bacon fat, salt pork, lard, and butter. Nondairy cream substitutes. Salad dressings with cheese or sour cream. Beverages Regular sodas and juice drinks with added sugar. Sweets and desserts Frosting. Pudding. Cookies. Cakes. Pies. Milk chocolate or white chocolate. Buttered syrups. Full-fat ice cream or ice cream drinks. The items listed above may not be a complete list of foods and drinks to avoid. Contact a dietitian for more information. Summary Heart-healthy meal planning includes eating less unhealthy fats, eating more healthy fats, and making other changes in your diet. Eat a balanced diet. This includes fruits and vegetables, low-fat or nonfat dairy, lean protein, nuts and legumes, whole grains, and  heart-healthy oils and fats. This information is not intended to replace advice given to you by your health care provider. Make sure you discuss any questions you have with your health care provider. Document Revised: 08/30/2020 Document Reviewed: 08/30/2020 Elsevier Patient Education  2022 Elsevier Inc.  

## 2021-06-17 NOTE — Progress Notes (Signed)
HPI  Pt presents to the clinic today to establish care and for management of the conditions listed below.   HLD: Her last LDL was 148, triglycerides 199, 2014. She report she was on medication for this in the past, but is not currently taking any medication for this. She does not consume a low fat diet.  OSA: She averages 6 hours of sleep with the use of her CPAP. There is no sleep study on file.  She also reports a rash of her posterior hairline and scalp.  She noticed this 6 months ago.  The rash itches.  She denies changes in soaps, lotions, detergents or hair products.  She has tried Aquaphor OTC with minimal relief of symptoms.  OA: Mainly in her knees.  She does see a chiropractor for this.  She is not interested in surgical intervention.  Past Medical History:  Diagnosis Date   Plantar fasciitis     Current Outpatient Medications  Medication Sig Dispense Refill   Omega 3 1000 MG CAPS Take by mouth. (Patient not taking: Reported on 06/17/2021)     No current facility-administered medications for this visit.    Allergies  Allergen Reactions   Asa [Aspirin] Other (See Comments)    "burning in stomach"     Family History  Problem Relation Age of Onset   Fibromyalgia Mother    Heart attack Mother        50s   Hypertension Mother    Congestive Heart Failure Mother    Other Father        unknown medical history   Bipolar disorder Sister     Social History   Socioeconomic History   Marital status: Married    Spouse name: Not on file   Number of children: Not on file   Years of education: Not on file   Highest education level: Not on file  Occupational History   Not on file  Tobacco Use   Smoking status: Former    Types: Cigarettes   Smokeless tobacco: Never  Vaping Use   Vaping Use: Never used  Substance and Sexual Activity   Alcohol use: Yes    Comment: occassional   Drug use: Never   Sexual activity: Not on file  Other Topics Concern   Not on file   Social History Narrative   Not on file   Social Determinants of Health   Financial Resource Strain: Not on file  Food Insecurity: Not on file  Transportation Needs: Not on file  Physical Activity: Not on file  Stress: Not on file  Social Connections: Not on file  Intimate Partner Violence: Not on file    ROS:  Constitutional: Denies fever, malaise, fatigue, headache or abrupt weight changes.  HEENT: Denies eye pain, eye redness, ear pain, ringing in the ears, wax buildup, runny nose, nasal congestion, bloody nose, or sore throat. Respiratory: Denies difficulty breathing, shortness of breath, cough or sputum production.   Cardiovascular: Denies chest pain, chest tightness, palpitations or swelling in the hands or feet.  Gastrointestinal: Denies abdominal pain, bloating, constipation, diarrhea or blood in the stool.  GU: Denies frequency, urgency, pain with urination, blood in urine, odor or discharge. Musculoskeletal: Patient reports bilateral knee pain.  Denies decrease in range of motion, difficulty with gait, muscle pain or joint swelling.  Skin: Patient reports rash of scalp.  Denies lesions or ulcercations.  Neurological: Patient reports intermittent numbness and tingling in her hands.  Denies dizziness, difficulty with memory, difficulty with  speech or problems with balance and coordination.  Psych: Denies anxiety, depression, SI/HI.  No other specific complaints in a complete review of systems (except as listed in HPI above).  PE:  BP 134/80 (BP Location: Right Arm, Patient Position: Sitting, Cuff Size: Large)    Pulse 64    Temp (!) 97.3 F (36.3 C) (Temporal)    Ht 5' (1.524 m)    Wt 170 lb (77.1 kg)    SpO2 100%    BMI 33.20 kg/m  Wt Readings from Last 3 Encounters:  06/17/21 170 lb (77.1 kg)  07/09/20 148 lb (67.1 kg)  07/14/18 160 lb (72.6 kg)    General: Appears her stated age, obese, in NAD. Skin: Contiguous scaly rash on erythematous base noted of posterior  hairline and intermittently throughout the scalp. HEENT: Head: normal shape and size; Eyes: EOMs intact;  Cardiovascular: Normal rate and rhythm. S1,S2 noted.  No murmur, rubs or gallops noted. No JVD or BLE edema. No carotid bruits noted. Pulmonary/Chest: Normal effort and positive vesicular breath sounds. No respiratory distress. No wheezes, rales or ronchi noted.  Musculoskeletal: No difficulty with gait.  Neurological: Alert and oriented.  Psychiatric: Mood and affect normal. Behavior is normal. Judgment and thought content normal.    BMET    Component Value Date/Time   NA 138 02/03/2013 0911   K 3.8 02/03/2013 0911   CL 104 02/03/2013 0911   CO2 30 02/03/2013 0911   GLUCOSE 90 02/03/2013 0911   BUN 13 02/03/2013 0911   CREATININE 0.90 02/03/2013 0911   CALCIUM 9.1 02/03/2013 0911   GFRNONAA >60 02/03/2013 0911   GFRAA >60 02/03/2013 0911    Lipid Panel     Component Value Date/Time   CHOL 244 (H) 02/03/2013 0911   TRIG 199 02/03/2013 0911   HDL 56 02/03/2013 0911   VLDL 40 02/03/2013 0911   LDLCALC 148 (H) 02/03/2013 0911    CBC    Component Value Date/Time   WBC 5.4 02/03/2013 0911   RBC 4.47 02/03/2013 0911   HGB 13.4 02/03/2013 0911   HCT 39.2 02/03/2013 0911   PLT 279 02/03/2013 0911   MCV 88 02/03/2013 0911   MCH 29.9 02/03/2013 0911   MCHC 34.1 02/03/2013 0911   RDW 12.7 02/03/2013 0911   LYMPHSABS 1.6 02/03/2013 0911   MONOABS 0.5 02/03/2013 0911   EOSABS 0.1 02/03/2013 0911   BASOSABS 0.0 02/03/2013 0911    Hgb A1C No results found for: HGBA1C   Assessment and Plan:   Nicki Reaper, NP This visit occurred during the SARS-CoV-2 public health emergency.  Safety protocols were in place, including screening questions prior to the visit, additional usage of staff PPE, and extensive cleaning of exam room while observing appropriate contact time as indicated for disinfecting solutions.

## 2021-06-17 NOTE — Assessment & Plan Note (Signed)
We will start Fluocinolone 0.1% external lotion daily Avoid excessively hot water

## 2021-06-18 ENCOUNTER — Ambulatory Visit (INDEPENDENT_AMBULATORY_CARE_PROVIDER_SITE_OTHER): Payer: BC Managed Care – PPO | Admitting: Internal Medicine

## 2021-06-18 VITALS — Ht 60.0 in | Wt 170.0 lb

## 2021-06-18 DIAGNOSIS — Z23 Encounter for immunization: Secondary | ICD-10-CM | POA: Diagnosis not present

## 2021-06-18 LAB — COMPLETE METABOLIC PANEL WITH GFR
AG Ratio: 1.3 (calc) (ref 1.0–2.5)
ALT: 15 U/L (ref 6–29)
AST: 16 U/L (ref 10–35)
Albumin: 4.2 g/dL (ref 3.6–5.1)
Alkaline phosphatase (APISO): 77 U/L (ref 37–153)
BUN: 17 mg/dL (ref 7–25)
CO2: 29 mmol/L (ref 20–32)
Calcium: 9.3 mg/dL (ref 8.6–10.4)
Chloride: 104 mmol/L (ref 98–110)
Creat: 0.83 mg/dL (ref 0.50–1.03)
Globulin: 3.2 g/dL (calc) (ref 1.9–3.7)
Glucose, Bld: 94 mg/dL (ref 65–99)
Potassium: 4.4 mmol/L (ref 3.5–5.3)
Sodium: 139 mmol/L (ref 135–146)
Total Bilirubin: 0.5 mg/dL (ref 0.2–1.2)
Total Protein: 7.4 g/dL (ref 6.1–8.1)
eGFR: 84 mL/min/{1.73_m2} (ref >=60)

## 2021-06-18 LAB — CBC
HCT: 39.9 % (ref 35.0–45.0)
Hemoglobin: 13.3 g/dL (ref 11.7–15.5)
MCH: 29.8 pg (ref 27.0–33.0)
MCHC: 33.3 g/dL (ref 32.0–36.0)
MCV: 89.5 fL (ref 80.0–100.0)
MPV: 9.6 fL (ref 7.5–12.5)
Platelets: 289 10*3/uL (ref 140–400)
RBC: 4.46 10*6/uL (ref 3.80–5.10)
RDW: 12.5 % (ref 11.0–15.0)
WBC: 5.3 10*3/uL (ref 3.8–10.8)

## 2021-06-18 LAB — LIPID PANEL
Cholesterol: 241 mg/dL — ABNORMAL HIGH (ref <200)
HDL: 49 mg/dL — ABNORMAL LOW (ref >=50)
LDL Cholesterol (Calc): 144 mg/dL (calc) — ABNORMAL HIGH
Non-HDL Cholesterol (Calc): 192 mg/dL (calc) — ABNORMAL HIGH (ref <130)
Total CHOL/HDL Ratio: 4.9 (calc) (ref <5.0)
Triglycerides: 315 mg/dL — ABNORMAL HIGH (ref <150)

## 2021-06-18 LAB — TEST AUTHORIZATION

## 2021-06-18 LAB — HEMOGLOBIN A1C
Hgb A1c MFr Bld: 5.6 % of total Hgb (ref <5.7)
Mean Plasma Glucose: 114 mg/dL
eAG (mmol/L): 6.3 mmol/L

## 2021-06-19 MED ORDER — ATORVASTATIN CALCIUM 10 MG PO TABS: 10.0000 mg | ORAL_TABLET | Freq: Every day | ORAL | 2 refills | Status: DC

## 2021-06-19 NOTE — Addendum Note (Signed)
Addended by: Lorre Munroe on: 06/19/2021 06:08 PM   Modules accepted: Orders

## 2021-07-05 ENCOUNTER — Encounter: Payer: Self-pay | Admitting: Internal Medicine

## 2021-07-09 ENCOUNTER — Encounter: Payer: Self-pay | Admitting: Internal Medicine

## 2021-07-09 ENCOUNTER — Other Ambulatory Visit: Payer: Self-pay

## 2021-07-09 ENCOUNTER — Ambulatory Visit: Payer: BC Managed Care – PPO | Admitting: Internal Medicine

## 2021-07-09 VITALS — BP 124/69 | HR 73 | Temp 96.9°F | Wt 166.0 lb

## 2021-07-09 DIAGNOSIS — J00 Acute nasopharyngitis [common cold]: Secondary | ICD-10-CM | POA: Diagnosis not present

## 2021-07-09 MED ORDER — AZITHROMYCIN 250 MG PO TABS: ORAL_TABLET | ORAL | 0 refills | Status: DC

## 2021-07-09 MED ORDER — PROMETHAZINE-DM 6.25-15 MG/5ML PO SYRP: 5.0000 mL | ORAL_SOLUTION | Freq: Four times a day (QID) | ORAL | 0 refills | Status: DC | PRN

## 2021-07-09 NOTE — Progress Notes (Signed)
HPI ? ?Patient presents to clinic today with complaint of headache, facial pressure, runny nose, ear fullness, cough and chest tightness.  She reports this started 1 week ago.  The headache is located in her forehead.  She describes the pain as pressure.  She is blowing clear mucus out of her nose.  She denies ringing in the ear, drainage or decreased hearing.  The cough is productive of blood-tinged green mucus.  She has some slight shortness of breath due to coughing.  She denies nasal congestion, sore throat, chest pain, nausea, vomiting, diarrhea, fever, chills or body aches.  She has tried Sudafed, Flonase, DayQuil and Mucinex OTC with some relief of symptoms.  She has not had sick contacts that she is aware of.  She has had her COVID-vaccine but has not had her flu vaccines.  She has no history of asthma or COPD.  She does not currently smoke. ? ?Review of Systems ? ?   ?Past Medical History:  ?Diagnosis Date  ? Plantar fasciitis   ? ? ?Family History  ?Problem Relation Age of Onset  ? Fibromyalgia Mother   ? Heart attack Mother   ?     52s  ? Hypertension Mother   ? Congestive Heart Failure Mother   ? Other Father   ?     unknown medical history  ? Bipolar disorder Sister   ? ? ?Social History  ? ?Socioeconomic History  ? Marital status: Married  ?  Spouse name: Not on file  ? Number of children: Not on file  ? Years of education: Not on file  ? Highest education level: Not on file  ?Occupational History  ? Not on file  ?Tobacco Use  ? Smoking status: Former  ?  Types: Cigarettes  ? Smokeless tobacco: Never  ?Vaping Use  ? Vaping Use: Never used  ?Substance and Sexual Activity  ? Alcohol use: Yes  ?  Comment: occassional  ? Drug use: Never  ? Sexual activity: Not on file  ?Other Topics Concern  ? Not on file  ?Social History Narrative  ? Not on file  ? ?Social Determinants of Health  ? ?Financial Resource Strain: Not on file  ?Food Insecurity: Not on file  ?Transportation Needs: Not on file  ?Physical  Activity: Not on file  ?Stress: Not on file  ?Social Connections: Not on file  ?Intimate Partner Violence: Not on file  ? ? ?Allergies  ?Allergen Reactions  ? Asa [Aspirin] Other (See Comments)  ?  "burning in stomach"   ? ? ? ?Constitutional: Positive headache. Denies fatigue, fever or abrupt weight changes.  ?HEENT:  Positive facial pressure and runny nose. Denies eye redness, eye pain, ear pain, ringing in the ears, wax buildup, nasal congestion or sore throat. ?Respiratory: Positive cough and shortness of breath. Denies difficulty breathing.  ?Cardiovascular: Denies chest pain, chest tightness, palpitations or swelling in the hands or feet.  ? ?No other specific complaints in a complete review of systems (except as listed in HPI above). ? ?Objective:  ? ?BP 124/69 (BP Location: Right Arm, Patient Position: Sitting, Cuff Size: Large)   Pulse 73   Temp (!) 96.9 ?F (36.1 ?C) (Temporal)   Wt 166 lb (75.3 kg)   SpO2 98%   BMI 32.42 kg/m?  ? ?General: Appears her stated age, in NAD. ?HEENT: Head: normal shape and size, maxillary sinus tenderness noted;  Ears: Tm's gray and intact, normal light reflex; Throat/Mouth: + PND. Teeth present, mucosa erythematous and  moist, no exudate noted, no lesions or ulcerations noted.  ?Neck:  No adenopathy noted.  ?Cardiovascular: Normal rate and rhythm. S1,S2 noted.  No murmur, rubs or gallops noted.  ?Pulmonary/Chest: Normal effort and positive vesicular breath sounds. No respiratory distress. No wheezes, rales or ronchi noted.  ? ?    ?Assessment & Plan:  ? ?Upper Respiratory Infection:  ? ?Encourage rest and fluids ?Rx for Azithromycin 250 mg p.o. x5 days ?Rx for Promethazine DM cough syrup-sedation caution given ?Continue Flonase OTC ? ?RTC as needed or if symptoms persist. ?Nicki Reaper, NP ? ?This visit occurred during the SARS-CoV-2 public health emergency.  Safety protocols were in place, including screening questions prior to the visit, additional usage of staff PPE,  and extensive cleaning of exam room while observing appropriate contact time as indicated for disinfecting solutions.  ? ?

## 2021-07-09 NOTE — Patient Instructions (Signed)

## 2021-07-29 ENCOUNTER — Ambulatory Visit (INDEPENDENT_AMBULATORY_CARE_PROVIDER_SITE_OTHER): Payer: BC Managed Care – PPO | Admitting: Internal Medicine

## 2021-07-29 VITALS — BP 131/77 | HR 64 | Resp 16 | Ht 60.0 in | Wt 167.0 lb

## 2021-07-29 DIAGNOSIS — Z9989 Dependence on other enabling machines and devices: Secondary | ICD-10-CM

## 2021-07-29 DIAGNOSIS — G4733 Obstructive sleep apnea (adult) (pediatric): Secondary | ICD-10-CM | POA: Diagnosis not present

## 2021-07-29 DIAGNOSIS — Z7189 Other specified counseling: Secondary | ICD-10-CM | POA: Diagnosis not present

## 2021-07-29 NOTE — Patient Instructions (Signed)

## 2021-07-29 NOTE — Progress Notes (Signed)
[facility] ?[address] ?[place], [place] [address] ? ?Pulmonary Sleep Medicine  ? ?Office Visit Note ? ?Patient Name: Diana Rose ?DOB: 1965-07-11 ?MRN [id number] ? ? ? ?Chief Complaint: Obstructive Sleep Apnea visit ? ?Brief History: ? ?[name] is seen today for follow up The patient has a 2.5 year history of sleep apnea. Patient is using PAP nightly on small N30i nasal mask.  The patient feels pretty after sleeping with PAP.  The patient reports benefiting from PAP use. Epworth Sleepiness Score is 4 out of 24. The patient does not  take naps. The patient complains of the following: wants replacement supplies regularly and has signed up for mailouts.  The compliance download shows  compliance with an average use time of 6:47 hours. The AHI is 1.9  The patient does not complain of limb movements disrupting sleep. ? ?ROS ? ?General: (-) fever, (-) chills, (-) night sweat ?Nose and Sinuses: (-) nasal stuffiness or itchiness, (-) postnasal drip, (-) nosebleeds, (-) sinus trouble. ?Mouth and Throat: (-) sore throat, (-) hoarseness. ?Neck: (-) swollen glands, (-) enlarged thyroid, (-) neck pain. ?Respiratory: - cough, - shortness of breath, - wheezing. ?Neurologic: - numbness, - tingling. ?Psychiatric: - anxiety, - depression ? ? ?Current Medication: ?Outpatient Encounter Medications as of 07/29/2021  ?Medication Sig  ? atorvastatin (LIPITOR) 10 MG tablet Take 1 tablet (10 mg total) by mouth daily.  ? [DISCONTINUED] azithromycin (ZITHROMAX) 250 MG tablet Take 2 tabs today, then 1 tab daily x 4 days  ? [DISCONTINUED] fluocinolone (SYNALAR) 0.01 % external solution Apply topically daily.  ? [DISCONTINUED] promethazine-dextromethorphan (PROMETHAZINE-DM) 6.25-15 MG/5ML syrup Take 5 mLs by mouth 4 (four) times daily as needed for cough.  ? ?No facility-administered encounter medications on file as of 07/29/2021.  ? ? ?Surgical History: ?Past Surgical History:  ?Procedure Laterality Date  ? TUBAL LIGATION     ? ? ?Medical History: ?Past Medical History:  ?Diagnosis Date  ? Plantar fasciitis   ? ? ?Family History: ?Non contributory to the present illness ? ?Social History: ?Social History  ? ?Socioeconomic History  ? Marital status: Married  ?  Spouse name: Not on file  ? Number of children: Not on file  ? Years of education: Not on file  ? Highest education level: Not on file  ?Occupational History  ? Not on file  ?Tobacco Use  ? Smoking status: Former  ?  Types: Cigarettes  ? Smokeless tobacco: Never  ?Vaping Use  ? Vaping Use: Never used  ?Substance and Sexual Activity  ? Alcohol use: Yes  ?  Comment: occassional  ? Drug use: Never  ? Sexual activity: Not on file  ?Other Topics Concern  ? Not on file  ?Social History Narrative  ? Not on file  ? ?Social Determinants of Health  ? ?Financial Resource Strain: Not on file  ?Food Insecurity: Not on file  ?Transportation Needs: Not on file  ?Physical Activity: Not on file  ?Stress: Not on file  ?Social Connections: Not on file  ?Intimate Partner Violence: Not on file  ? ? ?Vital Signs: ?Blood pressure 131/77, pulse 64, resp. rate 16, height 5' (1.524 m), weight 167 lb (75.8 kg), SpO2 98 %. ?Body mass index is 32.61 kg/m?.  ? ? ?Examination: ?General Appearance: The patient is well-developed, well-nourished, and in no distress. ?Neck Circumference: 34 cm ?Skin: Gross inspection of skin unremarkable. ?Head: normocephalic, no gross deformities. ?Eyes: no gross deformities noted. ?ENT: ears appear grossly normal ?Neurologic: Alert and oriented.  No involuntary movements. ? ? ? ?EPWORTH SLEEPINESS SCALE: ? ?Scale:  ?(0)= no chance of dozing; (1)= slight chance of dozing; (2)= moderate chance of dozing; (3)= high chance of dozing ? ?Chance  Situtation ?   ?Sitting and reading: 1 ?  ? Watching TV: 1 ?   ?Sitting Inactive in public: 0 ?   ?As a passenger in car: 1   ?   ?Lying down to rest: 1 ?   ?Sitting and talking: 0 ?   ?Sitting quielty after lunch: 0 ?   ?In a car, stopped in  traffic: 0 ? ? ?TOTAL SCORE:   4 out of 24 ? ? ? ?SLEEP STUDIES: ? ?PSG 07/25/18 -AHI 83.7, Low SpO2 58%, ? ? ?CPAP COMPLIANCE DATA: ? ?Date Range: 07/25/20 - 07/24/21 ? ?Average Daily Use: 6:47 hours ? ?Median Use: 7:02 hours ? ?Compliance for > 4 Hours: 91% days ? ?AHI: 1.9 respiratory events per hour ? ?Days Used: 353/365 ? ?Mask Leak: 7.5 lpm ? ?95th Percentile Pressure: 9cmH2O ? ? ? ? ?LABS: ?Recent Results (from the past 2160 hour(s))  ?CBC     Status: None  ? Collection Time: 06/17/21 10:55 AM  ?Result Value Ref Range  ? WBC 5.3 3.8 - 10.8 Thousand/uL  ? RBC 4.46 3.80 - 5.10 Million/uL  ? Hemoglobin 13.3 11.7 - 15.5 g/dL  ? HCT 39.9 35.0 - 45.0 %  ? MCV 89.5 80.0 - 100.0 fL  ? MCH 29.8 27.0 - 33.0 pg  ? MCHC 33.3 32.0 - 36.0 g/dL  ? RDW 12.5 11.0 - 15.0 %  ? Platelets 289 140 - 400 Thousand/uL  ? MPV 9.6 7.5 - 12.5 fL  ?COMPLETE METABOLIC PANEL WITH GFR     Status: None  ? Collection Time: 06/17/21 10:55 AM  ?Result Value Ref Range  ? Glucose, Bld 94 65 - 99 mg/dL  ?  Comment: . ?           Fasting reference interval ?. ?  ? BUN 17 7 - 25 mg/dL  ? Creat 0.83 0.50 - 1.03 mg/dL  ? eGFR 84 > OR = 60 mL/min/1.48m  ?  Comment: The eGFR is based on the CKD-EPI 2021 equation. To calculate  ?the new eGFR from a previous Creatinine or Cystatin C ?result, go to https://www.kidney.org/professionals/ ?kdoqi/gfr%5Fcalculator ?  ? BUN/Creatinine Ratio NOT APPLICABLE 6 - 22 (calc)  ? Sodium 139 135 - 146 mmol/L  ? Potassium 4.4 3.5 - 5.3 mmol/L  ? Chloride 104 98 - 110 mmol/L  ? CO2 29 20 - 32 mmol/L  ? Calcium 9.3 8.6 - 10.4 mg/dL  ? Total Protein 7.4 6.1 - 8.1 g/dL  ? Albumin 4.2 3.6 - 5.1 g/dL  ? Globulin 3.2 1.9 - 3.7 g/dL (calc)  ? AG Ratio 1.3 1.0 - 2.5 (calc)  ? Total Bilirubin 0.5 0.2 - 1.2 mg/dL  ? Alkaline phosphatase (APISO) 77 37 - 153 U/L  ? AST 16 10 - 35 U/L  ? ALT 15 6 - 29 U/L  ?Lipid panel     Status: Abnormal  ? Collection Time: 06/17/21 10:55 AM  ?Result Value Ref Range  ? Cholesterol 241 (H) <200 mg/dL  ?  HDL 49 (L) > OR = 50 mg/dL  ? Triglycerides 315 (H) <150 mg/dL  ?  Comment: . ?If a non-fasting specimen was collected, consider ?repeat triglyceride testing on a fasting specimen ?if clinically indicated.  ?JGarald Baldingal. J. of Clin. Lipidol. 24196;2:229-798 ?. ?  ?  LDL Cholesterol (Calc) 144 (H) mg/dL (calc)  ?  Comment: Reference range: <100 ?Marland Kitchen ?Desirable range <100 mg/dL for primary prevention;   ?<70 mg/dL for patients with CHD or diabetic patients  ?with > or = 2 CHD risk factors. ?. ?LDL-C is now calculated using the Martin-Hopkins  ?calculation, which is a validated novel method providing  ?better accuracy than the Friedewald equation in the  ?estimation of LDL-C.  ?Cresenciano Genre et al. Annamaria Helling. 5681;275(17): 2061-2068  ?(http://education.[facility].com/faq/FAQ164) ?  ? Total CHOL/HDL Ratio 4.9 <5.0 (calc)  ? Non-HDL Cholesterol (Calc) 192 (H) <130 mg/dL (calc)  ?  Comment: For patients with diabetes plus 1 major ASCVD risk  ?factor, treating to a non-HDL-C goal of <100 mg/dL  ?(LDL-C of <70 mg/dL) is considered a therapeutic  ?option. ?  ?Hemoglobin A1c     Status: None  ? Collection Time: 06/17/21 10:55 AM  ?Result Value Ref Range  ? Hgb A1c MFr Bld 5.6 <5.7 % of total Hgb  ?  Comment: For the purpose of screening for the presence of ?diabetes: ?. ?<5.7%       Consistent with the absence of diabetes ?5.7-6.4%    Consistent with increased risk for diabetes ?            (prediabetes) ?> or =6.5%  Consistent with diabetes ?Marland Kitchen ?This assay result is consistent with a decreased risk ?of diabetes. ?. ?Currently, no consensus exists regarding use of ?hemoglobin A1c for diagnosis of diabetes in children. ?. ?According to American Diabetes Association (ADA) ?guidelines, hemoglobin A1c <7.0% represents optimal ?control in non-pregnant diabetic patients. Different ?metrics may apply to specific patient populations.  ?Standards of Medical Care in Diabetes(ADA). ?. ?  ? Mean Plasma Glucose 114 mg/dL  ? eAG (mmol/L) 6.3  mmol/L  ?TEST AUTHORIZATION     Status: None  ? Collection Time: 06/17/21 10:55 AM  ?Result Value Ref Range  ? TEST NAME: HEMOGLOBIN A1C   ? TEST CODE: DX CODE G0174   ? CLIENT CONTACT: Webb Silversmith, NP

## 2021-08-06 ENCOUNTER — Ambulatory Visit: Payer: Self-pay | Admitting: Internal Medicine

## 2021-09-10 DIAGNOSIS — G4733 Obstructive sleep apnea (adult) (pediatric): Secondary | ICD-10-CM | POA: Diagnosis not present

## 2021-09-14 ENCOUNTER — Other Ambulatory Visit: Payer: Self-pay | Admitting: Internal Medicine

## 2021-09-16 NOTE — Telephone Encounter (Signed)
Requested Prescriptions  ?Pending Prescriptions Disp Refills  ?? atorvastatin (LIPITOR) 10 MG tablet [Pharmacy Med Name: ATORVASTATIN 10 MG TABLET] 90 tablet   ?  Sig: TAKE 1 TABLET BY MOUTH EVERY DAY  ?  ? Cardiovascular:  Antilipid - Statins Failed - 09/14/2021  9:24 AM  ?  ?  Failed - Lipid Panel in normal range within the last 12 months  ?  Cholesterol  ?Date Value Ref Range Status  ?06/17/2021 241 (H) <200 mg/dL Final  ?76/19/5093 267 (H) 0 - 200 mg/dL Final  ? ?Ldl Cholesterol, Calc  ?Date Value Ref Range Status  ?02/03/2013 148 (H) 0 - 100 mg/dL Final  ? ?LDL Cholesterol (Calc)  ?Date Value Ref Range Status  ?06/17/2021 144 (H) mg/dL (calc) Final  ?  Comment:  ?  Reference range: <100 ?Marland Kitchen ?Desirable range <100 mg/dL for primary prevention;   ?<70 mg/dL for patients with CHD or diabetic patients  ?with > or = 2 CHD risk factors. ?. ?LDL-C is now calculated using the Martin-Hopkins  ?calculation, which is a validated novel method providing  ?better accuracy than the Friedewald equation in the  ?estimation of LDL-C.  ?Horald Pollen et al. Lenox Ahr. 1245;809(98): 2061-2068  ?(http://education.[facility].com/faq/FAQ164) ?  ? ?HDL Cholesterol  ?Date Value Ref Range Status  ?02/03/2013 56 40 - 60 mg/dL Final  ? ?HDL  ?Date Value Ref Range Status  ?06/17/2021 49 (L) > OR = 50 mg/dL Final  ? ?Triglycerides  ?Date Value Ref Range Status  ?06/17/2021 315 (H) <150 mg/dL Final  ?  Comment:  ?  . ?If a non-fasting specimen was collected, consider ?repeat triglyceride testing on a fasting specimen ?if clinically indicated.  ?Henrene Pastor al. J. of Clin. Lipidol. 2015;9:129-169. ?. ?  ?02/03/2013 199 0 - 200 mg/dL Final  ? ?  ?  ?  Passed - Patient is not pregnant  ?  ?  Passed - Valid encounter within last 12 months  ?  Recent Outpatient Visits   ?      ? 2 months ago Acute nasopharyngitis  ? [facility] [place], [place], NP  ? 3 months ago Mixed hyperlipidemia  ? [facility] [place], Salvadore Oxford, NP  ?   ?  ?Future Appointments   ?        ? In 3 months Baity, Salvadore Oxford, NP [facility]  ?  ? ?  ?  ?  ? ? ?

## 2021-10-21 DIAGNOSIS — G4733 Obstructive sleep apnea (adult) (pediatric): Secondary | ICD-10-CM | POA: Diagnosis not present

## 2021-12-04 ENCOUNTER — Ambulatory Visit: Payer: BC Managed Care – PPO | Admitting: Family Medicine

## 2021-12-04 ENCOUNTER — Encounter: Payer: Self-pay | Admitting: Family Medicine

## 2021-12-04 VITALS — BP 136/72 | HR 68 | Ht 60.0 in | Wt 168.8 lb

## 2021-12-04 DIAGNOSIS — M2142 Flat foot [pes planus] (acquired), left foot: Secondary | ICD-10-CM

## 2021-12-04 DIAGNOSIS — M2141 Flat foot [pes planus] (acquired), right foot: Secondary | ICD-10-CM | POA: Diagnosis not present

## 2021-12-04 DIAGNOSIS — G8929 Other chronic pain: Secondary | ICD-10-CM | POA: Diagnosis not present

## 2021-12-04 DIAGNOSIS — M79671 Pain in right foot: Secondary | ICD-10-CM | POA: Diagnosis not present

## 2021-12-04 DIAGNOSIS — M170 Bilateral primary osteoarthritis of knee: Secondary | ICD-10-CM

## 2021-12-04 MED ORDER — NAPROXEN 500 MG PO TBEC: 500.0000 mg | DELAYED_RELEASE_TABLET | Freq: Two times a day (BID) | ORAL | 1 refills | Status: DC

## 2021-12-04 NOTE — Progress Notes (Signed)
Subjective:    Patient ID: Diana Rose, female    DOB: 1966/03/05, 56 y.o.   MRN: [id number]  Diana Rose is a 56 y.o. female presenting on 12/04/2021 for Ankle Pain  PCP Webb Silversmith, FNP   HPI  She has upcoming visit with PCP on 8/21 for her annual  Here today for acute visit  Right Foot / Ankle Pain  Background history with bilateral knee osteoarthritis, prior x-rays from chiropractor. Both knees bothering her with arthritis, she continues to go to chiropractor for general adjustment.  She admits now concern with compensating with her walking and function due to knees, she was relying more on her right Ankle and leg.  There was no acute injury or accident or fall or trauma. She works on concrete floors at work, she has Clinical biochemist - now composite boots. She wears orthotics with her work shoes and has new boots as well now.  No significant swelling with her ankle.  Symptoms with constant ache while walking weight bearing, moderate to severe at times. If resting and sitting with leg up without any weight on it it is 0 out of 10.  History of plantar fasciitis 2019 both feet, saw [facility] Day[facility] Katayama DPM. Not having similar symptoms. She can get out of bed and first steps are okay. If she puts shoe on and walks around it can hurt more.       12/04/2021    4:14 PM 07/09/2021    8:16 AM 06/17/2021   10:56 AM  Depression screen PHQ 2/9  Decreased Interest 0 0 0  Down, Depressed, Hopeless 0 0 0  PHQ - 2 Score 0 0 0  Altered sleeping 0 0   Tired, decreased energy 0 0   Change in appetite 0 0   Feeling bad or failure about yourself  0 0   Trouble concentrating 0 0   Moving slowly or fidgety/restless 0 0   Suicidal thoughts 0 0   PHQ-9 Score 0 0   Difficult doing work/chores Not difficult at all Not difficult at all     Social History   Tobacco Use   Smoking status: Former    Types: Cigarettes   Smokeless tobacco: Never  Vaping Use   Vaping Use: Never used   Substance Use Topics   Alcohol use: Yes    Comment: occassional   Drug use: Never    Review of Systems Per HPI unless specifically indicated above     Objective:    BP 136/72   Pulse 68   Ht 5' (1.524 m)   Wt 168 lb 12.8 oz (76.6 kg)   SpO2 98%   BMI 32.97 kg/m   Wt Readings from Last 3 Encounters:  12/04/21 168 lb 12.8 oz (76.6 kg)  07/29/21 167 lb (75.8 kg)  07/09/21 166 lb (75.3 kg)    Physical Exam Vitals and nursing note reviewed.  Constitutional:      General: She is not in acute distress.    Appearance: Normal appearance. She is well-developed. She is not diaphoretic.     Comments: Well-appearing, comfortable, cooperative  HENT:     Head: Normocephalic and atraumatic.  Eyes:     General:        Right eye: No discharge.        Left eye: No discharge.     Conjunctiva/sclera: Conjunctivae normal.  Cardiovascular:     Rate and Rhythm: Normal rate.  Pulmonary:     Effort:  Pulmonary effort is normal.  Musculoskeletal:     Comments: Pes planus bilateral, loss of medial arch on exam with foot deformity. Localized tender over medial angle ligaments questionable if tenderness into arch/plantar fascia. No other isolated abnormality or bony tenderness, has full range of motion dorsi plantar flexion talar tilt  Skin:    General: Skin is warm and dry.     Findings: No erythema or rash.  Neurological:     Mental Status: She is alert and oriented to person, place, and time.  Psychiatric:        Mood and Affect: Mood normal.        Behavior: Behavior normal.        Thought Content: Thought content normal.     Comments: Well groomed, good eye contact, normal speech and thoughts    Results for orders placed or performed in visit on 06/17/21  CBC  Result Value Ref Range   WBC 5.3 3.8 - 10.8 Thousand/uL   RBC 4.46 3.80 - 5.10 Million/uL   Hemoglobin 13.3 11.7 - 15.5 g/dL   HCT 39.9 35.0 - 45.0 %   MCV 89.5 80.0 - 100.0 fL   MCH 29.8 27.0 - 33.0 pg   MCHC 33.3 32.0  - 36.0 g/dL   RDW 12.5 11.0 - 15.0 %   Platelets 289 140 - 400 Thousand/uL   MPV 9.6 7.5 - 12.5 fL  COMPLETE METABOLIC PANEL WITH GFR  Result Value Ref Range   Glucose, Bld 94 65 - 99 mg/dL   BUN 17 7 - 25 mg/dL   Creat 0.83 0.50 - 1.03 mg/dL   eGFR 84 > OR = 60 mL/min/1.45m   BUN/Creatinine Ratio NOT APPLICABLE 6 - 22 (calc)   Sodium 139 135 - 146 mmol/L   Potassium 4.4 3.5 - 5.3 mmol/L   Chloride 104 98 - 110 mmol/L   CO2 29 20 - 32 mmol/L   Calcium 9.3 8.6 - 10.4 mg/dL   Total Protein 7.4 6.1 - 8.1 g/dL   Albumin 4.2 3.6 - 5.1 g/dL   Globulin 3.2 1.9 - 3.7 g/dL (calc)   AG Ratio 1.3 1.0 - 2.5 (calc)   Total Bilirubin 0.5 0.2 - 1.2 mg/dL   Alkaline phosphatase (APISO) 77 37 - 153 U/L   AST 16 10 - 35 U/L   ALT 15 6 - 29 U/L  Lipid panel  Result Value Ref Range   Cholesterol 241 (H) <200 mg/dL   HDL 49 (L) > OR = 50 mg/dL   Triglycerides 315 (H) <150 mg/dL   LDL Cholesterol (Calc) 144 (H) mg/dL (calc)   Total CHOL/HDL Ratio 4.9 <5.0 (calc)   Non-HDL Cholesterol (Calc) 192 (H) <130 mg/dL (calc)  Hemoglobin A1c  Result Value Ref Range   Hgb A1c MFr Bld 5.6 <5.7 % of total Hgb   Mean Plasma Glucose 114 mg/dL   eAG (mmol/L) 6.3 mmol/L  TEST AUTHORIZATION  Result Value Ref Range   TEST NAME: HEMOGLOBIN A1C    TEST CODE: DX CODE [id number]   CLIENT CONTACT: REGINA BAITY, NP    REPORT ALWAYS MESSAGE SIGNATURE        Assessment & Plan:   Problem List Items Addressed This Visit   None Visit Diagnoses     Chronic foot pain, right    -  Primary   Relevant Medications   naproxen (EC-NAPROXEN) 500 MG EC tablet   Pes planus of both feet       Bilateral primary osteoarthritis  of knee       Relevant Medications   naproxen (EC-NAPROXEN) 500 MG EC tablet       Clinically with functional foot pain R only medial aspect ankle/arch of foot W pes planus and knee OA DJD she has issue with compensating with her gait on R side that seems to have overburdened her feet No acute  injury or no suspected fracture or soft tissue instability Cannot rule out stress fracture but not characteristic on exam  Rx Nsaid EC coated naproxen 500 BID 1-2 weeks then PRN Tylenol as needed ASO Ankle Foot support Continue custom orthotics Activity modification Defer X-rays May return to Podiatry [facility] if not improving. Not characteristic of plantar fasciitis but may warrant other arch therapy AVS Exercises.  Ultimately if knee OA was improved, may have better mechanics with her ambulation  Meds ordered this encounter  Medications   naproxen (EC-NAPROXEN) 500 MG EC tablet    Sig: Take 1 tablet (500 mg total) by mouth 2 (two) times daily with a meal. For 2 weeks then as needed.    Dispense:  60 tablet    Refill:  1      Follow up plan: Return if symptoms worsen or fail to improve.   Nobie Putnam, DO [facility] 12/04/2021, 4:02 PM

## 2021-12-04 NOTE — Patient Instructions (Addendum)
Thank you for coming to the office today.  Recommend trial of Anti-inflammatory with Naproxen (Naprosyn) 500mg  tabs - take one with food and plenty of water TWICE daily every day (breakfast and dinner), for next 1 to 2 weeks, then you may take only as needed - DO NOT TAKE any ibuprofen, aleve, motrin while you are taking this medicine - It is safe to take Tylenol Ext Str 500mg  tabs - take 1 to 2 (max dose 1000mg ) every 6 hours as needed for breakthrough pain, max 24 hour daily dose is 6 to 8 tablets or 4000mg   START anti inflammatory topical - OTC Voltaren (generic Diclofenac) topical 2-4 times a day as needed for pain swelling of affected joint for 1-2 weeks or longer.  Likely foot pain from biomechanical issues we discussed with flat feet and compensating from knee. It seems the strain is on your inner ligaments of the right foot. No obvious tear or injury.  Compression with ASO Anle Brace for support would help  Future return to Podiatry [facility] if interested.  Please schedule a Follow-up Appointment to: Return if symptoms worsen or fail to improve.  If you have any other questions or concerns, please feel free to call the office or send a message through MyChart. You may also schedule an earlier appointment if necessary.  Additionally, you may be receiving a survey about your experience at our office within a few days to 1 week by e-mail or mail. We value your feedback.  , DO [facility], [facility]             Arch Pain Rehabilitation Exercises   Stretching: You may begin exercising the muscles of your foot right away by gently stretching them with the towel stretch. When the towel stretch becomes too easy, you may begin doing the standing calf stretch and plantar fascia stretch.  Towel stretch: Sit on a hard surface with your injured leg stretched out in front of you. Loop a towel around the ball of your foot and pull the towel toward your body  keeping your knee straight. Hold this position for 15 to 30 seconds then relax. Repeat 3 times. Standing calf stretch: Facing a wall, put your hands against the wall at about eye level. Keep the injured leg back, the uninjured leg forward, and the heel of your injured leg on the floor. Turn your injured foot slightly inward (as if you were pigeon-toed) as you slowly lean into the wall until you feel a stretch in the back of your calf. Hold for 15 to 30 seconds. Repeat 3 times. Do this exercise several times each day. When you can stand comfortably on your injured foot, you can begin stretching the plantar fascia at the bottom of your foot.  Plantar fascia stretch: Stand with the ball of your injured foot on a stair. Reach for the bottom step with your heel until you feel a stretch in the arch of your foot. Hold this position for 15 to 30 seconds and then relax. Repeat 3 times. Static and dynamic balance exercises  Place a chair next to your non-injured leg and stand upright. (This will provide you with balance if needed.) Stand on your injured foot. Try to raise the arch of your foot while keeping your toes on the floor. Try to maintain this position and balance on your injured side for 30 seconds. This exercise can be made more difficult by doing it on a piece of foam or a pillow,  or with your eyes closed.  Stand in the same position as above. Keep your foot in this position and reach forward in front of you with your injured side's hand, allowing your knee to bend. Repeat this 10 times while maintaining the arch height. This exercise can be made more difficult by reaching farther in front of you. Do 2 sets.  Stand in the same position as above. While maintaining your arch height, reach the injured side's hand across your body toward the chair. The farther you reach, the more challenging the exercise. Do 2 sets of 10.  Towel pickup: With your heel on the ground, pick up a towel with your toes. Release.  Repeat 10 to 20 times. When this gets easy, add more resistance by placing a book or small weight on the towel.  Frozen can roll: Roll your bare injured foot back and forth from your heel to your mid-arch over a frozen juice can. Repeat for 3 to 5 minutes. This exercise is particularly helpful if done first thing in the morning. Resisted dorsiflexion: Sit with your injured leg out straight and your foot facing a doorway. Tie a loop in one end of the tubing. Put your foot through the loop so that the tubing goes around the arch of your foot. Tie a knot in the other end of the tubing and shut the knot in the door. Move backward until there is tension in the tubing. Keeping your knee straight, pull your foot toward your body, stretching the tubing. Slowly return to the starting position. Do 3 sets of 10.  Next, you can begin strengthening the muscles of your foot and lower leg by doing the rest of the exercises.  Strengthening Resisted plantar flexion: Sit with your leg outstretched and loop the middle section of the tubing around the ball of your foot. Hold the ends of the tubing in both hands. Gently press the ball of your foot down and point your toes, stretching the tubing. Return to the starting position. Do 3 sets of 10.  Resisted inversion: Sit with your legs out straight and cross your uninjured leg over your injured ankle. Wrap the tubing around the ball of your injured foot and then loop it around your uninjured foot so that the tubing is anchored there at one end. Hold the other end of the tubing in your hand. Turn your injured foot inward and upward. This will stretch the tubing. Return to the starting position. Do 3 sets of 10.  Resisted eversion: Sit with both legs stretched out in front of you, with your feet about a shoulder's width apart. Tie a loop in one end of the tubing. Put your injured foot through the loop so that the tubing goes around the arch of that foot and wraps around the  outside of the uninjured foot. Hold onto the other end of the tubing with your hand to provide tension. Turn your injured foot up and out. Make sure you keep your uninjured foot still so that it will allow the tubing to stretch as you move your injured foot. Return to the starting position. Do 3 sets of 10.

## 2021-12-04 NOTE — Addendum Note (Signed)
Addended by: Smitty Cords on: 12/04/2021 06:47 PM   Modules accepted: Level of Service

## 2021-12-11 DIAGNOSIS — M25562 Pain in left knee: Secondary | ICD-10-CM | POA: Diagnosis not present

## 2021-12-11 DIAGNOSIS — M13862 Other specified arthritis, left knee: Secondary | ICD-10-CM | POA: Diagnosis not present

## 2021-12-11 DIAGNOSIS — M2141 Flat foot [pes planus] (acquired), right foot: Secondary | ICD-10-CM | POA: Diagnosis not present

## 2021-12-11 DIAGNOSIS — M25571 Pain in right ankle and joints of right foot: Secondary | ICD-10-CM | POA: Diagnosis not present

## 2021-12-16 ENCOUNTER — Encounter: Payer: Self-pay | Admitting: Internal Medicine

## 2021-12-18 DIAGNOSIS — M25571 Pain in right ankle and joints of right foot: Secondary | ICD-10-CM | POA: Diagnosis not present

## 2021-12-18 DIAGNOSIS — M2141 Flat foot [pes planus] (acquired), right foot: Secondary | ICD-10-CM | POA: Diagnosis not present

## 2021-12-23 ENCOUNTER — Other Ambulatory Visit (HOSPITAL_COMMUNITY)
Admission: RE | Admit: 2021-12-23 | Discharge: 2021-12-23 | Disposition: A | Discharge status: Home / Self Care | Payer: BC Managed Care – PPO | Source: Ambulatory Visit | Attending: Internal Medicine | Admitting: Internal Medicine

## 2021-12-23 ENCOUNTER — Encounter: Payer: Self-pay | Admitting: Internal Medicine

## 2021-12-23 ENCOUNTER — Ambulatory Visit (INDEPENDENT_AMBULATORY_CARE_PROVIDER_SITE_OTHER): Payer: BC Managed Care – PPO | Admitting: Internal Medicine

## 2021-12-23 VITALS — BP 134/70 | HR 63 | Temp 97.3°F | Ht 60.0 in | Wt 167.0 lb

## 2021-12-23 DIAGNOSIS — Z1159 Encounter for screening for other viral diseases: Secondary | ICD-10-CM | POA: Diagnosis not present

## 2021-12-23 DIAGNOSIS — E782 Mixed hyperlipidemia: Secondary | ICD-10-CM | POA: Diagnosis not present

## 2021-12-23 DIAGNOSIS — Z6832 Body mass index (BMI) 32.0-32.9, adult: Secondary | ICD-10-CM

## 2021-12-23 DIAGNOSIS — Z1231 Encounter for screening mammogram for malignant neoplasm of breast: Secondary | ICD-10-CM

## 2021-12-23 DIAGNOSIS — Z0001 Encounter for general adult medical examination with abnormal findings: Secondary | ICD-10-CM

## 2021-12-23 DIAGNOSIS — E66811 Obesity, class 1: Secondary | ICD-10-CM | POA: Insufficient documentation

## 2021-12-23 DIAGNOSIS — Z114 Encounter for screening for human immunodeficiency virus [HIV]: Secondary | ICD-10-CM

## 2021-12-23 DIAGNOSIS — Z124 Encounter for screening for malignant neoplasm of cervix: Secondary | ICD-10-CM | POA: Insufficient documentation

## 2021-12-23 DIAGNOSIS — E6609 Other obesity due to excess calories: Secondary | ICD-10-CM | POA: Insufficient documentation

## 2021-12-23 NOTE — Assessment & Plan Note (Signed)
Encourage diet and exercise for weight loss 

## 2021-12-23 NOTE — Progress Notes (Signed)
Subjective:    Patient ID: Diana Rose, female    DOB: 12/05/1965, 56 y.o.   MRN: [id number]  HPI  Patient presents to clinic today for her annual exam.  Flu: never Tetanus: 06/2021 COVID: [place] x2 Shingrix: never Pap smear: < 5 years ago Mammogram: 08/2017 Colon screening: 2019 Vision screening: annually Dentist: biannually  Diet: She does eat lean meat. She consumes fruits and veggies. She tries to avoid fried foods. She drinks mostly water, some soda, coffee Exercise: None  Review of Systems  Past Medical History:  Diagnosis Date   Plantar fasciitis     Current Outpatient Medications  Medication Sig Dispense Refill   atorvastatin (LIPITOR) 10 MG tablet TAKE 1 TABLET BY MOUTH EVERY DAY 90 tablet 1   naproxen (EC-NAPROXEN) 500 MG EC tablet Take 1 tablet (500 mg total) by mouth 2 (two) times daily with a meal. For 2 weeks then as needed. 60 tablet 1   No current facility-administered medications for this visit.    Allergies  Allergen Reactions   Asa [Aspirin] Other (See Comments)    "burning in stomach"     Family History  Problem Relation Age of Onset   Fibromyalgia Mother    Heart attack Mother        32s   Hypertension Mother    Congestive Heart Failure Mother    Other Father        unknown medical history   Bipolar disorder Sister     Social History   Socioeconomic History   Marital status: Married    Spouse name: Not on file   Number of children: Not on file   Years of education: Not on file   Highest education level: Not on file  Occupational History   Not on file  Tobacco Use   Smoking status: Former    Types: Cigarettes   Smokeless tobacco: Never  Vaping Use   Vaping Use: Never used  Substance and Sexual Activity   Alcohol use: Yes    Comment: occassional   Drug use: Never   Sexual activity: Not on file  Other Topics Concern   Not on file  Social History Narrative   Not on file   Social Determinants of Health   Financial  Resource Strain: Not on file  Food Insecurity: Not on file  Transportation Needs: Not on file  Physical Activity: Not on file  Stress: Not on file  Social Connections: Not on file  Intimate Partner Violence: Not on file     Constitutional: Denies fever, malaise, fatigue, headache or abrupt weight changes.  HEENT: Patient reports sore throat.  Denies eye pain, eye redness, ear pain, ringing in the ears, wax buildup, runny nose, nasal congestion, bloody nose. Respiratory: Denies difficulty breathing, shortness of breath, cough or sputum production.   Cardiovascular: Denies chest pain, chest tightness, palpitations or swelling in the hands or feet.  Gastrointestinal: Patient reports intermittent reflux.  Denies abdominal pain, bloating, constipation, diarrhea or blood in the stool.  GU: Patient is intermittent vaginal discharge.  Denies urgency, frequency, pain with urination, burning sensation, blood in urine, odor. Musculoskeletal: Patient reports left knee pain, right ankle pain.  Denies decrease in range of motion, difficulty with gait, muscle pain or joint swelling.  Skin: Patient reports dry skin.  Denies redness, rashes, lesions or ulcercations.  Neurological: Denies dizziness, difficulty with memory, difficulty with speech or problems with balance and coordination.  Psych: Denies anxiety, depression, SI/HI.  No other specific complaints  in a complete review of systems (except as listed in HPI above).     Objective:   Physical Exam  BP 134/70 (BP Location: Left Arm, Patient Position: Sitting, Cuff Size: Normal)   Pulse 63   Temp (!) 97.3 F (36.3 C) (Temporal)   Ht 5' (1.524 m)   Wt 167 lb (75.8 kg)   SpO2 100%   BMI 32.61 kg/m   Wt Readings from Last 3 Encounters:  12/04/21 168 lb 12.8 oz (76.6 kg)  07/29/21 167 lb (75.8 kg)  07/09/21 166 lb (75.3 kg)    General: Appears her stated age, obese, in NAD. Skin: Warm, dry and intact. HEENT: Head: normal shape and size;  Eyes: sclera white, no icterus, conjunctiva pink, PERRLA and EOMs intact; Throat/Mouth: Teeth present, mucosa erythematous and moist, no exudate, lesions or ulcerations noted.  Neck:  Neck supple, trachea midline. No masses, lumps or thyromegaly present.  Cardiovascular: Normal rate and rhythm. S1,S2 noted.  No murmur, rubs or gallops noted. No JVD or BLE edema. No carotid bruits noted. Pulmonary/Chest: Normal effort and positive vesicular breath sounds. No respiratory distress. No wheezes, rales or ronchi noted.  Abdomen: Soft and nontender. Normal bowel sounds. No distention or masses noted.  Pelvic: Normal female anatomy.  Cervix not well visualized. No CMT.  Adnexa nonpalpable. Musculoskeletal: She has some difficulty getting from a sitting to a standing position.  She has difficulty getting up on the exam table.  Strength 5/5 BUE/BLE.  No difficulty with gait.  Neurological: Alert and oriented. Cranial nerves II-XII grossly intact. Coordination normal.  Psychiatric: Mood and affect normal. Behavior is normal. Judgment and thought content normal.     BMET    Component Value Date/Time   NA 139 06/17/2021 1055   NA 138 02/03/2013 0911   K 4.4 06/17/2021 1055   K 3.8 02/03/2013 0911   CL 104 06/17/2021 1055   CL 104 02/03/2013 0911   CO2 29 06/17/2021 1055   CO2 30 02/03/2013 0911   GLUCOSE 94 06/17/2021 1055   GLUCOSE 90 02/03/2013 0911   BUN 17 06/17/2021 1055   BUN 13 02/03/2013 0911   CREATININE 0.83 06/17/2021 1055   CALCIUM 9.3 06/17/2021 1055   CALCIUM 9.1 02/03/2013 0911   GFRNONAA >60 02/03/2013 0911   GFRAA >60 02/03/2013 0911    Lipid Panel     Component Value Date/Time   CHOL 241 (H) 06/17/2021 1055   CHOL 244 (H) 02/03/2013 0911   TRIG 315 (H) 06/17/2021 1055   TRIG 199 02/03/2013 0911   HDL 49 (L) 06/17/2021 1055   HDL 56 02/03/2013 0911   CHOLHDL 4.9 06/17/2021 1055   VLDL 40 02/03/2013 0911   LDLCALC 144 (H) 06/17/2021 1055   LDLCALC 148 (H) 02/03/2013  0911    CBC    Component Value Date/Time   WBC 5.3 06/17/2021 1055   RBC 4.46 06/17/2021 1055   HGB 13.3 06/17/2021 1055   HGB 13.4 02/03/2013 0911   HCT 39.9 06/17/2021 1055   HCT 39.2 02/03/2013 0911   PLT 289 06/17/2021 1055   PLT 279 02/03/2013 0911   MCV 89.5 06/17/2021 1055   MCV 88 02/03/2013 0911   MCH 29.8 06/17/2021 1055   MCHC 33.3 06/17/2021 1055   RDW 12.5 06/17/2021 1055   RDW 12.7 02/03/2013 0911   LYMPHSABS 1.6 02/03/2013 0911   MONOABS 0.5 02/03/2013 0911   EOSABS 0.1 02/03/2013 0911   BASOSABS 0.0 02/03/2013 0911    Hgb A1C Lab  Results  Component Value Date   HGBA1C 5.6 06/17/2021           Assessment & Plan:   Preventative Health Maintenance:  Encouraged her to get a flu shot in the fall Tetanus UTD Encouraged her to get her COVID booster Discussed Shingrix vaccine, she will check coverage with her insurance company and schedule a nurse visit if she would like to have this done Pap smear today, she declines STD screening but would like a wet prep Mammogram ordered-she will call to schedule Colon screening UTD Encouraged her to consume a balanced diet and exercise regimen Advised her to see an eye doctor and dentist annually We will check CBC, c-Met, lipid, A1c, HIV and hep C today  RTC in 6 months, follow-up chronic conditions Webb Silversmith, NP

## 2021-12-23 NOTE — Patient Instructions (Signed)

## 2021-12-24 LAB — HIV ANTIBODY (ROUTINE TESTING W REFLEX): HIV 1&2 Ab, 4th Generation: NONREACTIVE

## 2021-12-24 LAB — COMPLETE METABOLIC PANEL WITH GFR
AG Ratio: 1.6 (calc) (ref 1.0–2.5)
ALT: 25 U/L (ref 6–29)
AST: 23 U/L (ref 10–35)
Albumin: 4.4 g/dL (ref 3.6–5.1)
Alkaline phosphatase (APISO): 83 U/L (ref 37–153)
BUN: 17 mg/dL (ref 7–25)
CO2: 26 mmol/L (ref 20–32)
Calcium: 9.4 mg/dL (ref 8.6–10.4)
Chloride: 106 mmol/L (ref 98–110)
Creat: 0.9 mg/dL (ref 0.50–1.03)
Globulin: 2.8 g/dL (calc) (ref 1.9–3.7)
Glucose, Bld: 96 mg/dL (ref 65–99)
Potassium: 4.4 mmol/L (ref 3.5–5.3)
Sodium: 141 mmol/L (ref 135–146)
Total Bilirubin: 0.4 mg/dL (ref 0.2–1.2)
Total Protein: 7.2 g/dL (ref 6.1–8.1)
eGFR: 75 mL/min/{1.73_m2} (ref >=60)

## 2021-12-24 LAB — TEST AUTHORIZATION

## 2021-12-24 LAB — HEMOGLOBIN A1C
Hgb A1c MFr Bld: 5.6 % of total Hgb (ref <5.7)
Mean Plasma Glucose: 114 mg/dL
eAG (mmol/L): 6.3 mmol/L

## 2021-12-24 LAB — CBC
HCT: 39.9 % (ref 35.0–45.0)
Hemoglobin: 13.3 g/dL (ref 11.7–15.5)
MCH: 29.9 pg (ref 27.0–33.0)
MCHC: 33.3 g/dL (ref 32.0–36.0)
MCV: 89.7 fL (ref 80.0–100.0)
MPV: 9.3 fL (ref 7.5–12.5)
Platelets: 260 10*3/uL (ref 140–400)
RBC: 4.45 10*6/uL (ref 3.80–5.10)
RDW: 12.3 % (ref 11.0–15.0)
WBC: 4.9 10*3/uL (ref 3.8–10.8)

## 2021-12-24 LAB — LIPID PANEL
Cholesterol: 176 mg/dL (ref <200)
HDL: 47 mg/dL — ABNORMAL LOW (ref >=50)
LDL Cholesterol (Calc): 101 mg/dL (calc) — ABNORMAL HIGH
Non-HDL Cholesterol (Calc): 129 mg/dL (calc) (ref <130)
Total CHOL/HDL Ratio: 3.7 (calc) (ref <5.0)
Triglycerides: 190 mg/dL — ABNORMAL HIGH (ref <150)

## 2021-12-24 LAB — HEPATITIS C ANTIBODY: Hepatitis C Ab: NONREACTIVE

## 2021-12-29 LAB — CYTOLOGY - PAP
Comment: NEGATIVE
Diagnosis: UNDETERMINED — AB
High risk HPV: NEGATIVE

## 2021-12-31 ENCOUNTER — Ambulatory Visit: Payer: Self-pay

## 2022-01-01 DIAGNOSIS — M25571 Pain in right ankle and joints of right foot: Secondary | ICD-10-CM | POA: Diagnosis not present

## 2022-01-21 ENCOUNTER — Ambulatory Visit
Admission: RE | Admit: 2022-01-21 | Discharge: 2022-01-21 | Disposition: A | Discharge status: Home / Self Care | Payer: BC Managed Care – PPO | Source: Ambulatory Visit | Attending: Internal Medicine | Admitting: Internal Medicine

## 2022-01-21 DIAGNOSIS — Z1231 Encounter for screening mammogram for malignant neoplasm of breast: Secondary | ICD-10-CM | POA: Diagnosis not present

## 2022-01-24 DIAGNOSIS — M1712 Unilateral primary osteoarthritis, left knee: Secondary | ICD-10-CM | POA: Diagnosis not present

## 2022-02-02 ENCOUNTER — Other Ambulatory Visit: Payer: Self-pay | Admitting: Family Medicine

## 2022-02-02 DIAGNOSIS — G8929 Other chronic pain: Secondary | ICD-10-CM

## 2022-02-03 NOTE — Telephone Encounter (Signed)
Filled 12/04/2021 #60 1 rf. Requested Prescriptions  Pending Prescriptions Disp Refills  . EC-NAPROXEN 500 MG EC tablet [Pharmacy Med Name: EC-NAPROXEN DR 500 MG TABLET] 60 tablet 1    Sig: TAKE 1 TABLET (500 MG TOTAL) BY MOUTH 2 (TWO) TIMES DAILY WITH A MEAL. FOR 2 WEEKS THEN AS NEEDED.     Analgesics:  NSAIDS Failed - 02/02/2022  9:15 AM      Failed - Manual Review: Labs are only required if the patient has taken medication for more than 8 weeks.      Passed - Cr in normal range and within 360 days    Creat  Date Value Ref Range Status  12/23/2021 0.90 0.50 - 1.03 mg/dL Final         Passed - HGB in normal range and within 360 days    Hemoglobin  Date Value Ref Range Status  12/23/2021 13.3 11.7 - 15.5 g/dL Final   HGB  Date Value Ref Range Status  02/03/2013 13.4 12.0 - 16.0 g/dL Final         Passed - PLT in normal range and within 360 days    Platelets  Date Value Ref Range Status  12/23/2021 260 140 - 400 Thousand/uL Final   Platelet  Date Value Ref Range Status  02/03/2013 279 150 - 440 x10 3/mm 3 Final         Passed - HCT in normal range and within 360 days    HCT  Date Value Ref Range Status  12/23/2021 39.9 35.0 - 45.0 % Final  02/03/2013 39.2 35.0 - 47.0 % Final         Passed - eGFR is 30 or above and within 360 days    EGFR (African American)  Date Value Ref Range Status  02/03/2013 >60  Final   EGFR (Non-African Amer.)  Date Value Ref Range Status  02/03/2013 >60  Final    Comment:    eGFR values <24m/min/1.73 m2 may be an indication of chronic kidney disease (CKD). Calculated eGFR is useful in patients with stable renal function. The eGFR calculation will not be reliable in acutely ill patients when serum creatinine is changing rapidly. It is not useful in  patients on dialysis. The eGFR calculation may not be applicable to patients at the low and high extremes of body sizes, pregnant women, and vegetarians.    eGFR  Date Value Ref Range  Status  12/23/2021 75 > OR = 60 mL/min/1.729mFinal         Passed - Patient is not pregnant      Passed - Valid encounter within last 12 months    Recent Outpatient Visits          1 month ago Encounter for general adult medical examination with abnormal findings   [facility]   2 months ago Chronic foot pain, right   [facility]   6 months ago Acute nasopharyngitis   [facility]   7 months ago Mixed hyperlipidemia   [facility]      Future Appointments            In 4 months Baity, [facility]

## 2022-02-25 ENCOUNTER — Encounter: Payer: Self-pay | Admitting: Internal Medicine

## 2022-03-03 ENCOUNTER — Encounter: Payer: Self-pay | Admitting: Internal Medicine

## 2022-03-03 ENCOUNTER — Ambulatory Visit: Payer: BC Managed Care – PPO | Admitting: Internal Medicine

## 2022-03-03 VITALS — BP 136/74 | HR 60 | Temp 96.8°F | Wt 172.0 lb

## 2022-03-03 DIAGNOSIS — L739 Follicular disorder, unspecified: Secondary | ICD-10-CM | POA: Diagnosis not present

## 2022-03-03 MED ORDER — TRIAMCINOLONE ACETONIDE 0.1 % EX CREA: 1.0000 | TOPICAL_CREAM | Freq: Two times a day (BID) | CUTANEOUS | 0 refills | Status: DC

## 2022-03-03 MED ORDER — MUPIROCIN 2 % EX OINT: 1.0000 | TOPICAL_OINTMENT | Freq: Two times a day (BID) | CUTANEOUS | 0 refills | Status: DC

## 2022-03-03 NOTE — Patient Instructions (Signed)
Folliculitis  Folliculitis occurs when hair follicles become inflamed. A hair follicle is a tiny opening in your skin where your hair grows from. This condition often occurs on the scalp, thighs, legs, back, and buttocks but can happen anywhere on the body. What are the causes? A common cause of this condition is an infection from bacteria. The type of folliculitis caused by bacteria can last a long time or go away and come back. The bacteria can live anywhere on your skin. They are often found in the nostrils. Other causes may include: An infection from a fungus. An infection from a virus. Your skin touching some chemicals, such as oils and tars. Shaving or waxing. Greasy ointments or creams put on the skin. What increases the risk? You are more likely to develop this condition if: Your body has a weak disease-fighting system (immune system). You have diabetes. You are obese. What are the signs or symptoms? Symptoms of this condition include: Redness. Soreness. Swelling. Itching. Small white or yellow, itchy spots filled with pus (pustules) that appear over a red area. If the infection goes deep into the follicle, these may turn into a boil (furuncle). A group of boils (carbuncle). These tend to form in hairy, sweaty areas of the body. How is this diagnosed? This condition is diagnosed with a skin exam. Your health care provider may take a sample of one of the pustules or boils to test in a lab. How is this treated? This condition may be treated by: Putting a warm, wet cloth (warm compress) on the affected areas. Taking antibiotics or applying them to the skin. Applying or bathing with a solution that kills germs (antiseptic). Taking an over-the-counter medicine. This can help with itching. Having a procedure to drain pustules or boils. This may be done if a pustule or boil contains a lot of pus or fluid. Having laser hair removal. This may be done when the condition lasts for a  long time. Follow these instructions at home: Managing pain and swelling  If directed, apply heat to the affected area as often as told by your health care provider. Use the heat source that your health care provider recommends, such as a moist heat pack or a heating pad. Place a towel between your skin and the heat source. Leave the heat on for 20-30 minutes. If your skin turns bright red, remove the heat right away to prevent burns. The risk of burns is higher if you cannot feel pain, heat, or cold. General instructions Take over-the-counter and prescription medicines only as told by your health care provider. If you were prescribed antibiotics, take or apply them as told by your health care provider. Do not stop using the antibiotic even if you start to feel better. Check your irritated area every day for signs of infection. Check for: More redness, swelling, or pain. Fluid or blood. Warmth. Pus or a bad smell. Do not shave irritated skin. Keep all follow-up visits. Your health care provider will check if the treatments are helping. Contact a health care provider if: You have a fever. You have any signs of infection. Red streaks are spreading from the affected area. This information is not intended to replace advice given to you by your health care provider. Make sure you discuss any questions you have with your health care provider. Document Revised: 09/24/2021 Document Reviewed: 09/24/2021 Elsevier Patient Education  2023 Elsevier Inc.  

## 2022-03-03 NOTE — Progress Notes (Signed)
Subjective:    Patient ID: Diana Rose, female    DOB: 05-Nov-1965, 56 y.o.   MRN: [id number]  HPI  Patient presents to clinic today with complaint of a rash.  This rash is located adjacent to her right breast near her axilla.  She noticed this 2 weeks ago.  The rash itches.  It has not seemed to spread.  She reports she has eczema which she feels like it has gotten worse.  She denies recent changes in soaps, lotions, detergents, medications or diet.  She lives alone and does not have any pets in her home.  She has been spending some time at her mother-in-law's house and they do have indoor dogs.  She has not ever been allergic to dogs that she is aware of.  She has been using an eczema lotion OTC as well as Fluocinolone solution and Benadryl with some relief of symptoms.  Review of Systems     Past Medical History:  Diagnosis Date   Plantar fasciitis     Current Outpatient Medications  Medication Sig Dispense Refill   atorvastatin (LIPITOR) 10 MG tablet TAKE 1 TABLET BY MOUTH EVERY DAY 90 tablet 1   naproxen (EC-NAPROXEN) 500 MG EC tablet Take 1 tablet (500 mg total) by mouth 2 (two) times daily with a meal. For 2 weeks then as needed. 60 tablet 1   Probiotic Product (PROBIOTIC BLEND PO) Take by mouth.     No current facility-administered medications for this visit.    Allergies  Allergen Reactions   Asa [Aspirin] Other (See Comments)    "burning in stomach"     Family History  Problem Relation Age of Onset   Fibromyalgia Mother    Heart attack Mother        32s   Hypertension Mother    Congestive Heart Failure Mother    Other Father        unknown medical history   Bipolar disorder Sister     Social History   Socioeconomic History   Marital status: Married    Spouse name: Not on file   Number of children: Not on file   Years of education: Not on file   Highest education level: Not on file  Occupational History   Not on file  Tobacco Use   Smoking status:  Former    Types: Cigarettes   Smokeless tobacco: Never  Vaping Use   Vaping Use: Never used  Substance and Sexual Activity   Alcohol use: Yes    Comment: occassional   Drug use: Never   Sexual activity: Not on file  Other Topics Concern   Not on file  Social History Narrative   Not on file   Social Determinants of Health   Financial Resource Strain: Not on file  Food Insecurity: Not on file  Transportation Needs: Not on file  Physical Activity: Not on file  Stress: Not on file  Social Connections: Not on file  Intimate Partner Violence: Not on file     Constitutional: Denies fever, malaise, fatigue, headache or abrupt weight changes.  HEENT: Denies eye pain, eye redness, ear pain, ringing in the ears, wax buildup, runny nose, nasal congestion, bloody nose, or sore throat. Respiratory: Denies difficulty breathing, shortness of breath, cough or sputum production.   Cardiovascular: Denies chest pain, chest tightness, palpitations or swelling in the hands or feet.  Skin: Patient reports rash.  Denies lesions or ulcercations.   No other specific complaints in a complete  review of systems (except as listed in HPI above).  Objective:   Physical Exam BP 136/74 (BP Location: Right Arm, Patient Position: Sitting, Cuff Size: Normal)   Pulse 60   Temp (!) 96.8 F (36 C) (Temporal)   Wt 172 lb (78 kg)   SpO2 99%   BMI 33.59 kg/m   Wt Readings from Last 3 Encounters:  12/23/21 167 lb (75.8 kg)  12/04/21 168 lb 12.8 oz (76.6 kg)  07/29/21 167 lb (75.8 kg)    General: Appears her stated age, obese, in NAD. Skin: Grouped, pustular lesions on erythematous base noted underneath right axilla, adjacent to her right breast. HEENT: Head: normal shape and size; Eyes: sclera white, no icterus, conjunctiva pink, PERRLA and EOMs intact;  Cardiovascular: Normal rate and rhythm.  Pulmonary/Chest: Normal effort and positive vesicular breath sounds. No respiratory distress. No wheezes, rales  or ronchi noted.  Neurological: Alert and oriented.   BMET    Component Value Date/Time   NA 141 12/23/2021 0959   NA 138 02/03/2013 0911   K 4.4 12/23/2021 0959   K 3.8 02/03/2013 0911   CL 106 12/23/2021 0959   CL 104 02/03/2013 0911   CO2 26 12/23/2021 0959   CO2 30 02/03/2013 0911   GLUCOSE 96 12/23/2021 0959   GLUCOSE 90 02/03/2013 0911   BUN 17 12/23/2021 0959   BUN 13 02/03/2013 0911   CREATININE 0.90 12/23/2021 0959   CALCIUM 9.4 12/23/2021 0959   CALCIUM 9.1 02/03/2013 0911   GFRNONAA >60 02/03/2013 0911   GFRAA >60 02/03/2013 0911    Lipid Panel     Component Value Date/Time   CHOL 176 12/23/2021 0959   CHOL 244 (H) 02/03/2013 0911   TRIG 190 (H) 12/23/2021 0959   TRIG 199 02/03/2013 0911   HDL 47 (L) 12/23/2021 0959   HDL 56 02/03/2013 0911   CHOLHDL 3.7 12/23/2021 0959   VLDL 40 02/03/2013 0911   LDLCALC 101 (H) 12/23/2021 0959   LDLCALC 148 (H) 02/03/2013 0911    CBC    Component Value Date/Time   WBC 4.9 12/23/2021 0959   RBC 4.45 12/23/2021 0959   HGB 13.3 12/23/2021 0959   HGB 13.4 02/03/2013 0911   HCT 39.9 12/23/2021 0959   HCT 39.2 02/03/2013 0911   PLT 260 12/23/2021 0959   PLT 279 02/03/2013 0911   MCV 89.7 12/23/2021 0959   MCV 88 02/03/2013 0911   MCH 29.9 12/23/2021 0959   MCHC 33.3 12/23/2021 0959   RDW 12.3 12/23/2021 0959   RDW 12.7 02/03/2013 0911   LYMPHSABS 1.6 02/03/2013 0911   MONOABS 0.5 02/03/2013 0911   EOSABS 0.1 02/03/2013 0911   BASOSABS 0.0 02/03/2013 0911    Hgb A1C Lab Results  Component Value Date   HGBA1C 5.6 12/23/2021            Assessment & Plan:   Folliculitis:  Rx for Mupirocin 2% cream twice daily mixed with Triamcinolone 0.1% cream twice daily Start Zyrtec 10 mg daily in a.m. Continue Benadryl as needed in p.m. Discussed referral to dermatology for further evaluation however she would like to hold off at this time  RTC in 4 months for follow-up of chronic conditions Nicki Reaper,  NP

## 2022-03-14 ENCOUNTER — Other Ambulatory Visit: Payer: Self-pay | Admitting: Internal Medicine

## 2022-03-14 NOTE — Telephone Encounter (Signed)
Requested Prescriptions  Pending Prescriptions Disp Refills   atorvastatin (LIPITOR) 10 MG tablet [Pharmacy Med Name: ATORVASTATIN 10 MG TABLET] 90 tablet 0    Sig: TAKE 1 TABLET BY MOUTH EVERY DAY     Cardiovascular:  Antilipid - Statins Failed - 03/14/2022  2:22 AM      Failed - Lipid Panel in normal range within the last 12 months    Cholesterol  Date Value Ref Range Status  12/23/2021 176 <200 mg/dL Final  66/44/0347 425 (H) 0 - 200 mg/dL Final   Ldl Cholesterol, Calc  Date Value Ref Range Status  02/03/2013 148 (H) 0 - 100 mg/dL Final   LDL Cholesterol (Calc)  Date Value Ref Range Status  12/23/2021 101 (H) mg/dL (calc) Final    Comment:    Reference range: <100 . Desirable range <100 mg/dL for primary prevention;   <70 mg/dL for patients with CHD or diabetic patients  with > or = 2 CHD risk factors. Marland Kitchen LDL-C is now calculated using the Martin-Hopkins  calculation, which is a validated novel method providing  better accuracy than the Friedewald equation in the  estimation of LDL-C.  Horald Pollen et al. Lenox Ahr. 9563;875(64): 2061-2068  (http://education.[employer].com/faq/FAQ164)    HDL Cholesterol  Date Value Ref Range Status  02/03/2013 56 40 - 60 mg/dL Final   HDL  Date Value Ref Range Status  12/23/2021 47 (L) > OR = 50 mg/dL Final   Triglycerides  Date Value Ref Range Status  12/23/2021 190 (H) <150 mg/dL Final  33/29/5188 416 0 - 200 mg/dL Final         Passed - Patient is not pregnant      Passed - Valid encounter within last 12 months    Recent Outpatient Visits           1 week ago Folliculitis   [facility] [place], Salvadore Oxford, NP   2 months ago Encounter for general adult medical examination with abnormal findings   [facility] [place], Salvadore Oxford, NP   3 months ago Chronic foot pain, right   [facility] [place], Netta Neat, DO   8 months ago Acute nasopharyngitis   [facility]  [place], Salvadore Oxford, NP   9 months ago Mixed hyperlipidemia   [facility] [place], Salvadore Oxford, NP       Future Appointments             In 3 months Baity, Salvadore Oxford, NP [facility], [facility]

## 2022-05-07 DIAGNOSIS — M1712 Unilateral primary osteoarthritis, left knee: Secondary | ICD-10-CM | POA: Diagnosis not present

## 2022-05-09 ENCOUNTER — Ambulatory Visit: Payer: BC Managed Care – PPO | Admitting: Podiatry

## 2022-05-09 ENCOUNTER — Ambulatory Visit (INDEPENDENT_AMBULATORY_CARE_PROVIDER_SITE_OTHER): Payer: BC Managed Care – PPO

## 2022-05-09 ENCOUNTER — Encounter: Payer: Self-pay | Admitting: Podiatry

## 2022-05-09 DIAGNOSIS — M779 Enthesopathy, unspecified: Secondary | ICD-10-CM

## 2022-05-09 DIAGNOSIS — M7751 Other enthesopathy of right foot: Secondary | ICD-10-CM

## 2022-05-09 DIAGNOSIS — R52 Pain, unspecified: Secondary | ICD-10-CM | POA: Diagnosis not present

## 2022-05-14 NOTE — Progress Notes (Signed)
   Chief Complaint  Patient presents with   Foot Pain    Pain located in the right foot, swollen ankle, xrays were taken at [facility], no broken bones, was given a boot,     HPI: 57 y.o. female presenting today for second opinion regarding right foot and ankle pain.  Patient has been treated and evaluated in the [facility] [place] for right foot and ankle pain that has been ongoing for over a month now.  Denies a history of injury.  No broken bones on x-rays taken at [facility].  She was placed in immobilization cam boot which did not help to alleviate her symptoms.  She presents for further treatment and evaluation  Past Medical History:  Diagnosis Date   Plantar fasciitis     Past Surgical History:  Procedure Laterality Date   TUBAL LIGATION      Allergies  Allergen Reactions   Asa [Aspirin] Other (See Comments)    "burning in stomach"      Physical Exam: General: The patient is alert and oriented x3 in no acute distress.  Dermatology: Skin is warm, dry and supple bilateral lower extremities. Negative for open lesions or macerations.  Vascular: Palpable pedal pulses bilaterally. Capillary refill within normal limits.  Negative for any significant edema or erythema  Neurological: Light touch and protective threshold grossly intact  Musculoskeletal Exam: No pedal deformities noted.  Diffuse tenderness and pain throughout palpation of the right foot and ankle.  Clinical evidence of hallux valgus deformity.  Moderate flatfoot deformity also noted  Radiographic Exam RT foot 05/09/2022:  Normal osseous mineralization. Joint spaces preserved. No fracture/dislocation/boney destruction.  Increased intermetatarsal angle with an increased hallux abductus angle greater than 30 degrees noted.  Medial deviation of the head of the talus with collapse of the medial longitudinal arch consistent with pes planovalgus deformity.  Assessment: 1.  Idiopathic pain and tenderness right foot and  ankle 2.  Asymptomatic bunion right  Plan of Care:  1. Patient evaluated. X-Rays reviewed.  2.  Continue cam boot as needed 3.  Patient continues to have pain and tenderness despite conservative treatment.  Order placed for MRI RT ankle without contrast 4.  Return to clinic after MRI to review results and discuss further treatment options      Edrick Kins, DPM [facility]  Dr. Edrick Kins, DPM    [address], [place] [address]                Office [phone]  Fax [phone]

## 2022-05-21 ENCOUNTER — Encounter: Payer: Self-pay | Admitting: Podiatry

## 2022-06-02 ENCOUNTER — Ambulatory Visit
Admission: RE | Admit: 2022-06-02 | Discharge: 2022-06-02 | Disposition: A | Discharge status: Home / Self Care | Payer: BC Managed Care – PPO | Source: Ambulatory Visit | Attending: Podiatry | Admitting: Podiatry

## 2022-06-02 DIAGNOSIS — M25471 Effusion, right ankle: Secondary | ICD-10-CM | POA: Diagnosis not present

## 2022-06-02 DIAGNOSIS — M7751 Other enthesopathy of right foot: Secondary | ICD-10-CM

## 2022-06-02 DIAGNOSIS — R600 Localized edema: Secondary | ICD-10-CM | POA: Diagnosis not present

## 2022-06-10 DIAGNOSIS — G4733 Obstructive sleep apnea (adult) (pediatric): Secondary | ICD-10-CM | POA: Diagnosis not present

## 2022-06-11 ENCOUNTER — Other Ambulatory Visit: Payer: Self-pay | Admitting: Internal Medicine

## 2022-06-11 NOTE — Telephone Encounter (Signed)
Requested Prescriptions  Pending Prescriptions Disp Refills   atorvastatin (LIPITOR) 10 MG tablet [Pharmacy Med Name: ATORVASTATIN 10 MG TABLET] 90 tablet 0    Sig: TAKE 1 TABLET BY MOUTH EVERY DAY     Cardiovascular:  Antilipid - Statins Failed - 06/11/2022  4:46 AM      Failed - Lipid Panel in normal range within the last 12 months    Cholesterol  Date Value Ref Range Status  12/23/2021 176 <200 mg/dL Final  02/03/2013 244 (H) 0 - 200 mg/dL Final   Ldl Cholesterol, Calc  Date Value Ref Range Status  02/03/2013 148 (H) 0 - 100 mg/dL Final   LDL Cholesterol (Calc)  Date Value Ref Range Status  12/23/2021 101 (H) mg/dL (calc) Final    Comment:    Reference range: <100 . Desirable range <100 mg/dL for primary prevention;   <70 mg/dL for patients with CHD or diabetic patients  with > or = 2 CHD risk factors. Marland Kitchen LDL-C is now calculated using the Martin-Hopkins  calculation, which is a validated novel method providing  better accuracy than the Friedewald equation in the  estimation of LDL-C.  Cresenciano Genre et al. Annamaria Helling. 8022;336(12): 2061-2068  (http://education.[employer].com/faq/FAQ164)    HDL Cholesterol  Date Value Ref Range Status  02/03/2013 56 40 - 60 mg/dL Final   HDL  Date Value Ref Range Status  12/23/2021 47 (L) > OR = 50 mg/dL Final   Triglycerides  Date Value Ref Range Status  12/23/2021 190 (H) <150 mg/dL Final  02/03/2013 199 0 - 200 mg/dL Final         Passed - Patient is not pregnant      Passed - Valid encounter within last 12 months    Recent Outpatient Visits           3 months ago [facility] [place], Coralie Keens, NP   5 months ago Encounter for general adult medical examination with abnormal findings   [facility] [place], Coralie Keens, NP   6 months ago Chronic foot pain, right   [facility] Olin Hauser, DO   11 months ago Acute  nasopharyngitis   [facility] [place], Coralie Keens, NP   11 months ago Mixed hyperlipidemia   [facility] [place], Coralie Keens, NP       Future Appointments             In 2 weeks Garnette Gunner, Coralie Keens, NP [facility], [facility]

## 2022-06-17 ENCOUNTER — Ambulatory Visit (INDEPENDENT_AMBULATORY_CARE_PROVIDER_SITE_OTHER): Payer: BC Managed Care – PPO | Admitting: Podiatry

## 2022-06-17 DIAGNOSIS — M779 Enthesopathy, unspecified: Secondary | ICD-10-CM

## 2022-06-17 NOTE — Progress Notes (Signed)
   Chief Complaint  Patient presents with   Plantar Fasciitis    Right foot plantar fasciitis follow-up, MRI results, Patient is takin the Mobic     HPI: 57 y.o. female presenting today for follow-up evaluation of right foot and ankle pain that has been ongoing now for over a month.  MRI was ordered last visit.  Patient states that since last visit she is actually improved significantly.  She says the meloxicam and cortisone injection helped significantly.  Presenting today to review the MRI results and discuss further treatment options   Brief history: Patient originally presented for a second opinion regarding right foot and ankle pain.  She had been treated and evaluated in the [facility] for right foot and ankle pain that had been ongoing for over a month.  Denies a history of injury.  No broken bones on x-rays taken at [facility].  She was placed in immobilization cam boot which did not help to alleviate her symptoms.    Past Medical History:  Diagnosis Date   Plantar fasciitis     Past Surgical History:  Procedure Laterality Date   TUBAL LIGATION      Allergies  Allergen Reactions   Asa [Aspirin] Other (See Comments)    "burning in stomach"      Physical Exam: General: The patient is alert and oriented x3 in no acute distress.  Dermatology: Skin is warm, dry and supple bilateral lower extremities. Negative for open lesions or macerations.  Vascular: Palpable pedal pulses bilaterally. Capillary refill within normal limits.  Negative for any significant edema or erythema  Neurological: Light touch and protective threshold grossly intact  Musculoskeletal Exam: No pedal deformities noted.  Today there is minimal tenderness with palpation throughout the foot or ankle.  No edema noted.  Specifically there is no tenderness or pain along the posterior tibial tendon of the ankle.  Radiographic Exam RT foot 05/09/2022:  Normal osseous mineralization. Joint spaces preserved. No  fracture/dislocation/boney destruction.  Increased intermetatarsal angle with an increased hallux abductus angle greater than 30 degrees noted.  Medial deviation of the head of the talus with collapse of the medial longitudinal arch consistent with pes planovalgus deformity.  MR ANKLE RIGHT WO CONTRAST 06/02/2022: IMPRESSION: 1. Thickening of the posterior tibial tendon with surrounding edema suggesting tendinosis with tenosynovitis. 2. Ligaments of the medial and lateral ankle are intact. 3. No evidence of fracture or osteonecrosis. 4. 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  Assessment: 1.  Idiopathic pain and tenderness right foot and ankle 2.  Asymptomatic bunion right  Plan of Care:  1. Patient evaluated.  MRI reviewed.  2.  Overall the patient has improved significantly since last visit.  She only has minimal tenderness. 3.  Continue wearing good supportive shoes and sneakers 4.  Continue meloxicam 15 mg daily as needed 5.  Return to clinic as needed      Edrick Kins, DPM [facility]  Dr. Edrick Kins, DPM    [address], [place] [address]                Office [phone]  Fax [phone]

## 2022-06-30 ENCOUNTER — Ambulatory Visit (INDEPENDENT_AMBULATORY_CARE_PROVIDER_SITE_OTHER): Payer: BC Managed Care – PPO | Admitting: Internal Medicine

## 2022-06-30 ENCOUNTER — Encounter: Payer: Self-pay | Admitting: Internal Medicine

## 2022-06-30 VITALS — BP 118/62 | HR 65 | Temp 96.8°F | Wt 168.0 lb

## 2022-06-30 DIAGNOSIS — G4733 Obstructive sleep apnea (adult) (pediatric): Secondary | ICD-10-CM

## 2022-06-30 DIAGNOSIS — Z6832 Body mass index (BMI) 32.0-32.9, adult: Secondary | ICD-10-CM

## 2022-06-30 DIAGNOSIS — E782 Mixed hyperlipidemia: Secondary | ICD-10-CM | POA: Diagnosis not present

## 2022-06-30 DIAGNOSIS — F430 Acute stress reaction: Secondary | ICD-10-CM

## 2022-06-30 DIAGNOSIS — M170 Bilateral primary osteoarthritis of knee: Secondary | ICD-10-CM

## 2022-06-30 DIAGNOSIS — R7309 Other abnormal glucose: Secondary | ICD-10-CM

## 2022-06-30 DIAGNOSIS — F411 Generalized anxiety disorder: Secondary | ICD-10-CM | POA: Insufficient documentation

## 2022-06-30 DIAGNOSIS — L308 Other specified dermatitis: Secondary | ICD-10-CM | POA: Diagnosis not present

## 2022-06-30 DIAGNOSIS — E6609 Other obesity due to excess calories: Secondary | ICD-10-CM

## 2022-06-30 MED ORDER — TRIAMCINOLONE ACETONIDE 0.1 % EX CREA: 1.0000 | TOPICAL_CREAM | Freq: Two times a day (BID) | CUTANEOUS | 0 refills | Status: DC

## 2022-06-30 MED ORDER — MUPIROCIN 2 % EX OINT: 1.0000 | TOPICAL_OINTMENT | Freq: Two times a day (BID) | CUTANEOUS | 0 refills | Status: DC

## 2022-06-30 MED ORDER — BUSPIRONE HCL 5 MG PO TABS: 5.0000 mg | ORAL_TABLET | Freq: Every day | ORAL | 0 refills | Status: DC | PRN

## 2022-06-30 NOTE — Assessment & Plan Note (Signed)
Encourage weight loss as this can help sleep apnea Continue CPAP use

## 2022-06-30 NOTE — Assessment & Plan Note (Signed)
C-Met and lipid profile today Encouraged consume a low-fat diet Continue atorvastatin

## 2022-06-30 NOTE — Assessment & Plan Note (Signed)
Encourage diet and exercise for weight loss 

## 2022-06-30 NOTE — Progress Notes (Signed)
Subjective:    Patient ID: Diana Rose, female    DOB: [birth year], 57 y.o.   MRN: [id number]  HPI  Patient presents to clinic today for follow-up of chronic conditions.  HLD: Her last LDL was 101, triglycerides 190, 12/2021.  She denies myalgias on Atorvastatin.  She tries to6  consume low-fat diet.  OSA: She averages hours of sleep per night with the use of her CPAP.  There is no sleep study on file.  Eczema: Managed with Triamcinolone cream.  She does not follow with dermatology.  OA: Mainly in her knees and right ankle/foot.  She sees a Restaurant manager, fast food as needed.  She takes Meloxicam as needed would some leaf of symptoms.  She does not follow with orthopedics.  Review of Systems  Past Medical History:  Diagnosis Date   Plantar fasciitis     Current Outpatient Medications  Medication Sig Dispense Refill   atorvastatin (LIPITOR) 10 MG tablet TAKE 1 TABLET BY MOUTH EVERY DAY 90 tablet 0   mupirocin ointment (BACTROBAN) 2 % Apply 1 Application topically 2 (two) times daily. 22 g 0   naproxen (EC-NAPROXEN) 500 MG EC tablet Take 1 tablet (500 mg total) by mouth 2 (two) times daily with a meal. For 2 weeks then as needed. (Patient not taking: Reported on 05/09/2022) 60 tablet 1   Probiotic Product (PROBIOTIC BLEND PO) Take by mouth.     triamcinolone cream (KENALOG) 0.1 % Apply 1 Application topically 2 (two) times daily. 30 g 0   No current facility-administered medications for this visit.    Allergies  Allergen Reactions   Asa [Aspirin] Other (See Comments)    "burning in stomach"     Family History  Problem Relation Age of Onset   Fibromyalgia Mother    Heart attack Mother        69s   Hypertension Mother    Congestive Heart Failure Mother    Other Father        unknown medical history   Bipolar disorder Sister     Social History   Socioeconomic History   Marital status: Legally Separated    Spouse name: Not on file   Number of children: Not on file   Years of  education: Not on file   Highest education level: Not on file  Occupational History   Not on file  Tobacco Use   Smoking status: Former    Types: Cigarettes   Smokeless tobacco: Never  Vaping Use   Vaping Use: Never used  Substance and Sexual Activity   Alcohol use: Yes    Comment: occassional   Drug use: Never   Sexual activity: Not on file  Other Topics Concern   Not on file  Social History Narrative   Not on file   Social Determinants of Health   Financial Resource Strain: Not on file  Food Insecurity: Not on file  Transportation Needs: Not on file  Physical Activity: Not on file  Stress: Not on file  Social Connections: Not on file  Intimate Partner Violence: Not on file     Constitutional: Denies fever, malaise, fatigue, headache or abrupt weight changes.  HEENT: Denies eye pain, eye redness, ear pain, ringing in the ears, wax buildup, runny nose, nasal congestion, bloody nose, or sore throat. Respiratory: Denies difficulty breathing, shortness of breath, cough or sputum production.   Cardiovascular: Denies chest pain, chest tightness, palpitations or swelling in the hands or feet.  Gastrointestinal: Denies abdominal pain, bloating, constipation,  diarrhea or blood in the stool.  GU: Denies urgency, frequency, pain with urination, burning sensation, blood in urine, odor or discharge. Musculoskeletal: Patient reports intermittent knee pain, chronic right foot and ankle pain.  Denies decrease in range of motion, difficulty with gait, muscle pain or joint swelling.  Skin: Denies redness, rashes, lesions or ulcercations.  Neurological: Denies dizziness, difficulty with memory, difficulty with speech or problems with balance and coordination.  Psych: Pt reports anxiety and stress. Denies depression, SI/HI.  No other specific complaints in a complete review of systems (except as listed in HPI above).     Objective:   Physical Exam  BP 118/62 (BP Location: Left Arm,  Patient Position: Sitting, Cuff Size: Normal)   Pulse 65   Temp (!) 96.8 F (36 C) (Temporal)   Wt 168 lb (76.2 kg)   SpO2 98%   BMI 32.81 kg/m   Wt Readings from Last 3 Encounters:  03/03/22 172 lb (78 kg)  12/23/21 167 lb (75.8 kg)  12/04/21 168 lb 12.8 oz (76.6 kg)    General: Appears her stated age, obese, in NAD. Skin: Warm, dry and intact.  Scaly rash noted behind bilateral ears. HEENT: Head: normal shape and size; Eyes: sclera white, no icterus, conjunctiva pink, PERRLA and EOMs intact;  Cardiovascular: Normal rate and rhythm. S1,S2 noted.  No murmur, rubs or gallops noted. No JVD or BLE edema. No carotid bruits noted. Pulmonary/Chest: Normal effort and positive vesicular breath sounds. No respiratory distress. No wheezes, rales or ronchi noted.  Musculoskeletal: Strength 5/5 BUE/BLE. No difficulty with gait.  Neurological: Alert and oriented. Coordination normal.  Psychiatric: Mood and affect normal. Mildly anxious appearing. Judgment and thought content normal.    BMET    Component Value Date/Time   NA 141 12/23/2021 0959   NA 138 02/03/2013 0911   K 4.4 12/23/2021 0959   K 3.8 02/03/2013 0911   CL 106 12/23/2021 0959   CL 104 02/03/2013 0911   CO2 26 12/23/2021 0959   CO2 30 02/03/2013 0911   GLUCOSE 96 12/23/2021 0959   GLUCOSE 90 02/03/2013 0911   BUN 17 12/23/2021 0959   BUN 13 02/03/2013 0911   CREATININE 0.90 12/23/2021 0959   CALCIUM 9.4 12/23/2021 0959   CALCIUM 9.1 02/03/2013 0911   GFRNONAA >60 02/03/2013 0911   GFRAA >60 02/03/2013 0911    Lipid Panel     Component Value Date/Time   CHOL 176 12/23/2021 0959   CHOL 244 (H) 02/03/2013 0911   TRIG 190 (H) 12/23/2021 0959   TRIG 199 02/03/2013 0911   HDL 47 (L) 12/23/2021 0959   HDL 56 02/03/2013 0911   CHOLHDL 3.7 12/23/2021 0959   VLDL 40 02/03/2013 0911   LDLCALC 101 (H) 12/23/2021 0959   LDLCALC 148 (H) 02/03/2013 0911    CBC    Component Value Date/Time   WBC 4.9 12/23/2021 0959    RBC 4.45 12/23/2021 0959   HGB 13.3 12/23/2021 0959   HGB 13.4 02/03/2013 0911   HCT 39.9 12/23/2021 0959   HCT 39.2 02/03/2013 0911   PLT 260 12/23/2021 0959   PLT 279 02/03/2013 0911   MCV 89.7 12/23/2021 0959   MCV 88 02/03/2013 0911   MCH 29.9 12/23/2021 0959   MCHC 33.3 12/23/2021 0959   RDW 12.3 12/23/2021 0959   RDW 12.7 02/03/2013 0911   LYMPHSABS 1.6 02/03/2013 0911   MONOABS 0.5 02/03/2013 0911   EOSABS 0.1 02/03/2013 0911   BASOSABS 0.0 02/03/2013 0911  Hgb A1C Lab Results  Component Value Date   HGBA1C 5.6 12/23/2021           Assessment & Plan:     RTC in 6 months for your annual exam Webb Silversmith, NP

## 2022-06-30 NOTE — Assessment & Plan Note (Signed)
Continue triamcinolone cream, refilled today

## 2022-06-30 NOTE — Patient Instructions (Signed)

## 2022-06-30 NOTE — Assessment & Plan Note (Signed)
Will trial buspirone Support offered

## 2022-06-30 NOTE — Assessment & Plan Note (Signed)
Encourage weight loss as this can help reduce joint pain Continue meloxicam C-Met today

## 2022-07-01 ENCOUNTER — Other Ambulatory Visit: Payer: Self-pay | Admitting: Internal Medicine

## 2022-07-01 LAB — HEMOGLOBIN A1C
Hgb A1c MFr Bld: 6 % of total Hgb — ABNORMAL HIGH (ref <5.7)
Mean Plasma Glucose: 126 mg/dL
eAG (mmol/L): 7 mmol/L

## 2022-07-01 LAB — COMPLETE METABOLIC PANEL WITH GFR
AG Ratio: 1.6 (calc) (ref 1.0–2.5)
ALT: 17 U/L (ref 6–29)
AST: 16 U/L (ref 10–35)
Albumin: 4.4 g/dL (ref 3.6–5.1)
Alkaline phosphatase (APISO): 79 U/L (ref 37–153)
BUN: 16 mg/dL (ref 7–25)
CO2: 28 mmol/L (ref 20–32)
Calcium: 9.2 mg/dL (ref 8.6–10.4)
Chloride: 104 mmol/L (ref 98–110)
Creat: 0.79 mg/dL (ref 0.50–1.03)
Globulin: 2.7 g/dL (calc) (ref 1.9–3.7)
Glucose, Bld: 101 mg/dL — ABNORMAL HIGH (ref 65–99)
Potassium: 4.4 mmol/L (ref 3.5–5.3)
Sodium: 140 mmol/L (ref 135–146)
Total Bilirubin: 0.5 mg/dL (ref 0.2–1.2)
Total Protein: 7.1 g/dL (ref 6.1–8.1)
eGFR: 88 mL/min/{1.73_m2} (ref >=60)

## 2022-07-01 LAB — LIPID PANEL
Cholesterol: 162 mg/dL (ref <200)
HDL: 55 mg/dL (ref >=50)
LDL Cholesterol (Calc): 79 mg/dL (calc)
Non-HDL Cholesterol (Calc): 107 mg/dL (calc) (ref <130)
Total CHOL/HDL Ratio: 2.9 (calc) (ref <5.0)
Triglycerides: 185 mg/dL — ABNORMAL HIGH (ref <150)

## 2022-07-01 LAB — CBC
HCT: 39.4 % (ref 35.0–45.0)
Hemoglobin: 13.1 g/dL (ref 11.7–15.5)
MCH: 30 pg (ref 27.0–33.0)
MCHC: 33.2 g/dL (ref 32.0–36.0)
MCV: 90.2 fL (ref 80.0–100.0)
MPV: 9.4 fL (ref 7.5–12.5)
Platelets: 267 10*3/uL (ref 140–400)
RBC: 4.37 10*6/uL (ref 3.80–5.10)
RDW: 12.1 % (ref 11.0–15.0)
WBC: 5.3 10*3/uL (ref 3.8–10.8)

## 2022-07-01 NOTE — Telephone Encounter (Signed)
Requested Prescriptions  Pending Prescriptions Disp Refills   triamcinolone cream (KENALOG) 0.1 % [Pharmacy Med Name: TRIAMCINOLONE 0.1% CREAM] 30 g 0    Sig: APPLY TO AFFECTED AREA TWICE A DAY     Not Delegated - Dermatology:  Corticosteroids Failed - 07/01/2022 11:56 AM      Failed - This refill cannot be delegated      Passed - Valid encounter within last 12 months    Recent Outpatient Visits           Yesterday Mixed hyperlipidemia   [facility] [place], Coralie Keens, NP   4 months ago [facility] [place], Coralie Keens, NP   6 months ago Encounter for general adult medical examination with abnormal findings   [facility] [place], Coralie Keens, NP   6 months ago Chronic foot pain, right   [facility] Olin Hauser, DO   11 months ago Acute nasopharyngitis   [facility] [place], Coralie Keens, NP       Future Appointments             In 6 months [place], Coralie Keens, NP [facility], [facility]

## 2022-07-07 ENCOUNTER — Ambulatory Visit (INDEPENDENT_AMBULATORY_CARE_PROVIDER_SITE_OTHER): Payer: BC Managed Care – PPO | Admitting: Internal Medicine

## 2022-07-07 VITALS — BP 131/84 | HR 74 | Resp 14 | Ht 60.0 in | Wt 164.0 lb

## 2022-07-07 DIAGNOSIS — G4733 Obstructive sleep apnea (adult) (pediatric): Secondary | ICD-10-CM

## 2022-07-07 DIAGNOSIS — Z7189 Other specified counseling: Secondary | ICD-10-CM

## 2022-07-07 NOTE — Progress Notes (Signed)
[facility] [place], [place] [address]  Pulmonary Sleep Medicine   Office Visit Note  Patient Name: Diana Rose DOB: 04/05/1966 MRN [id number]    Chief Complaint: Obstructive Sleep Apnea visit  Brief History:  Britteney is seen today for an annual follow up on CPAP at 9 cmh20. The patient has a 3.5 year history of sleep apnea. Patient is using PAP nightly.  The patient feels rested after sleeping with PAP.  The patient reports benefiting from PAP use. Reported sleepiness is improved and the Epworth Sleepiness Score is 4 out of 24. The patient does not take naps. The patient complains of the following: No complaints.  The compliance download shows 99% compliance with an average use time of 7 hours 35 minutes. The AHI is 1.3.  The patient does not complain of limb movements disrupting sleep. She has moved in with her ex mother in law to assist in her care.   ROS  General: (-) fever, (-) chills, (-) night sweat Nose and Sinuses: (-) nasal stuffiness or itchiness, (-) postnasal drip, (-) nosebleeds, (-) sinus trouble. Mouth and Throat: (-) sore throat, (-) hoarseness. Neck: (-) swollen glands, (-) enlarged thyroid, (-) neck pain. Respiratory: - cough, - shortness of breath, - wheezing. Neurologic: - numbness, - tingling. Psychiatric: - anxiety, - depression   Current Medication: Outpatient Encounter Medications as of 07/07/2022  Medication Sig   atorvastatin (LIPITOR) 10 MG tablet TAKE 1 TABLET BY MOUTH EVERY DAY   busPIRone (BUSPAR) 5 MG tablet Take 1 tablet (5 mg total) by mouth daily as needed.   meloxicam (MOBIC) 15 MG tablet Take 1 tablet by mouth daily.   mupirocin ointment (BACTROBAN) 2 % Apply 1 Application topically 2 (two) times daily.   Probiotic Product (PROBIOTIC BLEND PO) Take by mouth.   triamcinolone cream (KENALOG) 0.1 % Apply 1 Application topically 2 (two) times daily.   No facility-administered encounter medications on file as of  07/07/2022.    Surgical History: Past Surgical History:  Procedure Laterality Date   TUBAL LIGATION      Medical History: Past Medical History:  Diagnosis Date   Plantar fasciitis     Family History: Non contributory to the present illness  Social History: Social History   Socioeconomic History   Marital status: Legally Separated    Spouse name: Not on file   Number of children: Not on file   Years of education: Not on file   Highest education level: Not on file  Occupational History   Not on file  Tobacco Use   Smoking status: Former    Types: Cigarettes   Smokeless tobacco: Never  Vaping Use   Vaping Use: Never used  Substance and Sexual Activity   Alcohol use: Yes    Comment: occassional   Drug use: Never   Sexual activity: Not on file  Other Topics Concern   Not on file  Social History Narrative   Not on file   Social Determinants of Health   Financial Resource Strain: Not on file  Food Insecurity: Not on file  Transportation Needs: Not on file  Physical Activity: Not on file  Stress: Not on file  Social Connections: Not on file  Intimate Partner Violence: Not on file    Vital Signs: Blood pressure 131/84, pulse 74, resp. rate 14, height 5' (1.524 m), weight 164 lb (74.4 kg), SpO2 97 %. Body mass index is 32.03 kg/m.    Examination: General Appearance: The patient is  well-developed, well-nourished, and in no distress. Neck Circumference: 36 cm Skin: Gross inspection of skin unremarkable. Head: normocephalic, no gross deformities. Eyes: no gross deformities noted. ENT: ears appear grossly normal Neurologic: Alert and oriented. No involuntary movements.  STOP BANG RISK ASSESSMENT S (snore) Have you been told that you snore?     NO   T (tired) Are you often tired, fatigued, or sleepy during the day?   NO  O (obstruction) Do you stop breathing, choke, or gasp during sleep? NO   P (pressure) Do you have or are you being treated for high blood  pressure? NO   B (BMI) Is your body index greater than 35 kg/m? NO   A (age) Are you 56 years old or older? YES   N (neck) Do you have a neck circumference greater than 16 inches?   NO   G (gender) Are you a female? NO   TOTAL STOP/BANG "YES" ANSWERS 1       A STOP-Bang score of 2 or less is considered low risk, and a score of 5 or more is high risk for having either moderate or severe OSA. For people who score 3 or 4, doctors may need to perform further assessment to determine how likely they are to have OSA.         EPWORTH SLEEPINESS SCALE:  Scale:  (0)= no chance of dozing; (1)= slight chance of dozing; (2)= moderate chance of dozing; (3)= high chance of dozing  Chance  Situtation    Sitting and reading: 1    Watching TV: 1    Sitting Inactive in public: 0    As a passenger in car: 0      Lying down to rest: 2    Sitting and talking: 0    Sitting quielty after lunch: 0    In a car, stopped in traffic: 0   TOTAL SCORE:   4 out of 24    SLEEP STUDIES:  PSG (07/25/18) AHI 83.7, min SPO2 58%   CPAP COMPLIANCE DATA:  Date Range: 07/06/21 - 07/05/22  Average Daily Use: 7 hours 35 minutes  Median Use: 7 hours 27 minutes  Compliance for > 4 Hours: 360 days  AHI: 1.3 respiratory events per hour  Days Used: 363/365  Mask Leak: 5.9  95th Percentile Pressure: 9 cmh20         LABS: Recent Results (from the past 2160 hour(s))  CBC     Status: None   Collection Time: 06/30/22  8:32 AM  Result Value Ref Range   WBC 5.3 3.8 - 10.8 Thousand/uL   RBC 4.37 3.80 - 5.10 Million/uL   Hemoglobin 13.1 11.7 - 15.5 g/dL   HCT 39.4 35.0 - 45.0 %   MCV 90.2 80.0 - 100.0 fL   MCH 30.0 27.0 - 33.0 pg   MCHC 33.2 32.0 - 36.0 g/dL   RDW 12.1 11.0 - 15.0 %   Platelets 267 140 - 400 Thousand/uL   MPV 9.4 7.5 - 12.5 fL  COMPLETE METABOLIC PANEL WITH GFR     Status: Abnormal   Collection Time: 06/30/22  8:32 AM  Result Value Ref Range   Glucose, Bld 101 (H) 65  - 99 mg/dL    Comment: .            Fasting reference interval . For someone without known diabetes, a glucose value between 100 and 125 mg/dL is consistent with prediabetes and should be confirmed with a follow-up test. .  BUN 16 7 - 25 mg/dL   Creat 0.79 0.50 - 1.03 mg/dL   eGFR 88 > OR = 60 mL/min/1.8m   BUN/Creatinine Ratio SEE NOTE: 6 - 22 (calc)    Comment:    Not Reported: BUN and Creatinine are within    reference range. .    Sodium 140 135 - 146 mmol/L   Potassium 4.4 3.5 - 5.3 mmol/L   Chloride 104 98 - 110 mmol/L   CO2 28 20 - 32 mmol/L   Calcium 9.2 8.6 - 10.4 mg/dL   Total Protein 7.1 6.1 - 8.1 g/dL   Albumin 4.4 3.6 - 5.1 g/dL   Globulin 2.7 1.9 - 3.7 g/dL (calc)   AG Ratio 1.6 1.0 - 2.5 (calc)   Total Bilirubin 0.5 0.2 - 1.2 mg/dL   Alkaline phosphatase (APISO) 79 37 - 153 U/L   AST 16 10 - 35 U/L   ALT 17 6 - 29 U/L  Lipid panel     Status: Abnormal   Collection Time: 06/30/22  8:32 AM  Result Value Ref Range   Cholesterol 162 <200 mg/dL   HDL 55 > OR = 50 mg/dL   Triglycerides 185 (H) <150 mg/dL   LDL Cholesterol (Calc) 79 mg/dL (calc)    Comment: Reference range: <100 . Desirable range <100 mg/dL for primary prevention;   <70 mg/dL for patients with CHD or diabetic patients  with > or = 2 CHD risk factors. .Marland KitchenLDL-C is now calculated using the Martin-Hopkins  calculation, which is a validated novel method providing  better accuracy than the Friedewald equation in the  estimation of LDL-C.  MCresenciano Genreet al. JAnnamaria Helling [id number]: [id number]  (http://education.[employer].com/faq/FAQ164)    Total CHOL/HDL Ratio 2.9 <5.0 (calc)   Non-HDL Cholesterol (Calc) 107 <130 mg/dL (calc)    Comment: For patients with diabetes plus 1 major ASCVD risk  factor, treating to a non-HDL-C goal of <100 mg/dL  (LDL-C of <70 mg/dL) is considered a therapeutic  option.   Hemoglobin A1c     Status: Abnormal   Collection Time: 06/30/22  8:32 AM  Result Value Ref  Range   Hgb A1c MFr Bld 6.0 (H) <5.7 % of total Hgb    Comment: For someone without known diabetes, a hemoglobin  A1c value between 5.7% and 6.4% is consistent with prediabetes and should be confirmed with a  follow-up test. . For someone with known diabetes, a value <7% indicates that their diabetes is well controlled. A1c targets should be individualized based on duration of diabetes, age, comorbid conditions, and other considerations. . This assay result is consistent with an increased risk of diabetes. . Currently, no consensus exists regarding use of hemoglobin A1c for diagnosis of diabetes for children. .    Mean Plasma Glucose 126 mg/dL   eAG (mmol/L) 7.0 mmol/L    Comment: . HbA1c performed on Roche platform. Effective 02/10/22 a change in test platforms may have  shifted HbA1c results compared to historical results.     Radiology: MR ANKLE RIGHT WO CONTRAST  Result Date: 06/03/2022 CLINICAL DATA:  Ankle pain, negative x-ray medial and anterior right ankle pain EXAM: MRI OF THE RIGHT ANKLE WITHOUT CONTRAST TECHNIQUE: Multiplanar, multisequence MR imaging of the ankle was performed. No intravenous contrast was administered. COMPARISON:  Radiographs dated May 09, 2022 FINDINGS: TENDONS Peroneal: Peroneal longus tendon intact. Peroneal brevis intact. Posteromedial: Thickening of the posterior tibial tendon suggesting tendinosis with surrounding edema. Flexor hallucis longus tendon intact. Flexor digitorum longus  tendon intact. Anterior: Tibialis anterior tendon intact. Extensor hallucis longus tendon intact Extensor digitorum longus tendon intact. Achilles:  Intact. Plantar Fascia: Intact. LIGAMENTS Lateral: Anterior talofibular ligament intact. Calcaneofibular ligament intact. Posterior talofibular ligament intact. Anterior and posterior tibiofibular ligaments intact. Medial: Deltoid ligament intact. Spring ligament intact. CARTILAGE Ankle Joint: No joint effusion. Normal  ankle mortise. No chondral defect. Subtalar Joints/Sinus Tarsi: Normal subtalar joints. No subtalar joint effusion. Normal sinus tarsi. Bones: No marrow signal abnormality. No fracture or dislocation. Joint space narrowing and articular cartilage thinning at the calcaneocuboid joint with small joint effusion. Soft Tissue: No fluid collection or hematoma. Muscles are normal without edema or atrophy. Tarsal tunnel is normal. IMPRESSION: 1. Thickening of the posterior tibial tendon with surrounding edema suggesting tendinosis with tenosynovitis. 2. Ligaments of the medial and lateral ankle are intact. 3. No evidence of fracture or osteonecrosis. 4. 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 Electronically Signed   By: Keane Police D.O.   On: 06/03/2022 12:46    No results found.  No results found.    Assessment and Plan: Patient Active Problem List   Diagnosis Date Noted   Primary osteoarthritis of both knees 06/30/2022   Anxiety as acute reaction to gross stress 06/30/2022   Class 1 obesity due to excess calories with body mass index (BMI) of 32.0 to 32.9 in adult 12/23/2021   Mixed hyperlipidemia 06/17/2021   Eczema 06/17/2021   OSA on CPAP 07/09/2020   1. OSA on CPAP The patient does  tolerate PAP and reports  benefit from PAP use. The patient was reminded how to clean equipment and advised to replace supplies routinely. The patient was also counselled on weight loss. The compliance is excellent. The AHI is 1.2.   OSA on cpap- controlled. Continue with excellent compliance with pap. CPAP continues to be medically necessary to treat this patient's OSA. F/u one year.     2. CPAP use counseling CPAP Counseling: had a lengthy discussion with the patient regarding the importance of PAP therapy in management of the sleep apnea. Patient appears to understand the risk factor reduction and also understands the risks associated with untreated sleep apnea. Patient will  try to make a good faith effort to remain compliant with therapy. Also instructed the patient on proper cleaning of the device including the water must be changed daily if possible and use of distilled water is preferred. Patient understands that the machine should be regularly cleaned with appropriate recommended cleaning solutions that do not damage the PAP machine for example given white vinegar and water rinses. Other methods such as ozone treatment may not be as good as these simple methods to achieve cleaning.     General Counseling: I have discussed the findings of the evaluation and examination with Vaughan Basta.  I have also discussed any further diagnostic evaluation thatmay be needed or ordered today. Tinea verbalizes understanding of the findings of todays visit. We also reviewed her medications today and discussed drug interactions and side effects including but not limited excessive drowsiness and altered mental states. We also discussed that there is always a risk not just to her but also people around her. she has been encouraged to call the office with any questions or concerns that should arise related to todays visit.  No orders of the defined types were placed in this encounter.       I have personally obtained a history, examined the patient, evaluated laboratory and imaging results, formulated the  assessment and plan and placed orders. This patient was seen today by Tressie Ellis, PA-C in collaboration with Dr. Devona Konig.   Allyne Gee, MD [facility] Diplomate ABMS Pulmonary Critical Care Medicine and Sleep Medicine

## 2022-07-07 NOTE — Patient Instructions (Signed)

## 2022-07-08 ENCOUNTER — Ambulatory Visit (INDEPENDENT_AMBULATORY_CARE_PROVIDER_SITE_OTHER): Payer: BC Managed Care – PPO | Admitting: Podiatry

## 2022-07-08 DIAGNOSIS — M779 Enthesopathy, unspecified: Secondary | ICD-10-CM

## 2022-08-06 ENCOUNTER — Other Ambulatory Visit: Payer: Self-pay

## 2022-08-26 ENCOUNTER — Other Ambulatory Visit: Payer: Self-pay | Admitting: Podiatry

## 2022-08-26 DIAGNOSIS — M7751 Other enthesopathy of right foot: Secondary | ICD-10-CM

## 2022-09-01 ENCOUNTER — Encounter: Payer: Self-pay | Admitting: Internal Medicine

## 2022-09-03 ENCOUNTER — Encounter: Payer: Self-pay | Admitting: Internal Medicine

## 2022-09-03 ENCOUNTER — Ambulatory Visit (INDEPENDENT_AMBULATORY_CARE_PROVIDER_SITE_OTHER): Payer: BC Managed Care – PPO | Admitting: Internal Medicine

## 2022-09-03 VITALS — BP 142/90 | HR 68 | Temp 96.6°F | Wt 163.0 lb

## 2022-09-03 DIAGNOSIS — R42 Dizziness and giddiness: Secondary | ICD-10-CM | POA: Diagnosis not present

## 2022-09-03 DIAGNOSIS — I10 Essential (primary) hypertension: Secondary | ICD-10-CM

## 2022-09-03 DIAGNOSIS — R7303 Prediabetes: Secondary | ICD-10-CM | POA: Insufficient documentation

## 2022-09-03 DIAGNOSIS — E6609 Other obesity due to excess calories: Secondary | ICD-10-CM | POA: Diagnosis not present

## 2022-09-03 DIAGNOSIS — Z6831 Body mass index (BMI) 31.0-31.9, adult: Secondary | ICD-10-CM | POA: Diagnosis not present

## 2022-09-03 MED ORDER — LISINOPRIL 10 MG PO TABS: 10.0000 mg | ORAL_TABLET | Freq: Every day | ORAL | 0 refills | Status: DC

## 2022-09-03 NOTE — Assessment & Plan Note (Signed)
Encourage diet and exercise for weight loss 

## 2022-09-03 NOTE — Patient Instructions (Signed)
Hypertension, Adult ?Hypertension is another name for high blood pressure. High blood pressure forces your heart to work harder to pump blood. This can cause problems over time. ?There are two numbers in a blood pressure reading. There is a top number (systolic) over a bottom number (diastolic). It is best to have a blood pressure that is below 120/80. ?What are the causes? ?The cause of this condition is not known. Some other conditions can lead to high blood pressure. ?What increases the risk? ?Some lifestyle factors can make you more likely to develop high blood pressure: ?Smoking. ?Not getting enough exercise or physical activity. ?Being overweight. ?Having too much fat, sugar, calories, or salt (sodium) in your diet. ?Drinking too much alcohol. ?Other risk factors include: ?Having any of these conditions: ?Heart disease. ?Diabetes. ?High cholesterol. ?Kidney disease. ?Obstructive sleep apnea. ?Having a family history of high blood pressure and high cholesterol. ?Age. The risk increases with age. ?Stress. ?What are the signs or symptoms? ?High blood pressure may not cause symptoms. Very high blood pressure (hypertensive crisis) may cause: ?Headache. ?Fast or uneven heartbeats (palpitations). ?Shortness of breath. ?Nosebleed. ?Vomiting or feeling like you may vomit (nauseous). ?Changes in how you see. ?Very bad chest pain. ?Feeling dizzy. ?Seizures. ?How is this treated? ?This condition is treated by making healthy lifestyle changes, such as: ?Eating healthy foods. ?Exercising more. ?Drinking less alcohol. ?Your doctor may prescribe medicine if lifestyle changes do not help enough and if: ?Your top number is above 130. ?Your bottom number is above 80. ?Your personal target blood pressure may vary. ?Follow these instructions at home: ?Eating and drinking ? ?If told, follow the DASH eating plan. To follow this plan: ?Fill one half of your plate at each meal with fruits and vegetables. ?Fill one fourth of your plate  at each meal with whole grains. Whole grains include whole-wheat pasta, brown rice, and whole-grain bread. ?Eat or drink low-fat dairy products, such as skim milk or low-fat yogurt. ?Fill one fourth of your plate at each meal with low-fat (lean) proteins. Low-fat proteins include fish, chicken without skin, eggs, beans, and tofu. ?Avoid fatty meat, cured and processed meat, or chicken with skin. ?Avoid pre-made or processed food. ?Limit the amount of salt in your diet to less than 1,500 mg each day. ?Do not drink alcohol if: ?Your doctor tells you not to drink. ?You are pregnant, may be pregnant, or are planning to become pregnant. ?If you drink alcohol: ?Limit how much you have to: ?0-1 drink a day for women. ?0-2 drinks a day for men. ?Know how much alcohol is in your drink. In the [place] one drink equals one 12 oz bottle of beer (355 mL), one 5 oz glass of wine (148 mL), or one 1? oz glass of hard liquor (44 mL). ?Lifestyle ? ?Work with your doctor to stay at a healthy weight or to lose weight. Ask your doctor what the best weight is for you. ?Get at least 30 minutes of exercise that causes your heart to beat faster (aerobic exercise) most days of the week. This may include walking, swimming, or biking. ?Get at least 30 minutes of exercise that strengthens your muscles (resistance exercise) at least 3 days a week. This may include lifting weights or doing Pilates. ?Do not smoke or use any products that contain nicotine or tobacco. If you need help quitting, ask your doctor. ?Check your blood pressure at home as told by your doctor. ?Keep all follow-up visits. ?Medicines ?Take over-the-counter and prescription medicines   only as told by your doctor. Follow directions carefully. ?Do not skip doses of blood pressure medicine. The medicine does not work as well if you skip doses. Skipping doses also puts you at risk for problems. ?Ask your doctor about side effects or reactions to medicines that you should watch  for. ?Contact a doctor if: ?You think you are having a reaction to the medicine you are taking. ?You have headaches that keep coming back. ?You feel dizzy. ?You have swelling in your ankles. ?You have trouble with your vision. ?Get help right away if: ?You get a very bad headache. ?You start to feel mixed up (confused). ?You feel weak or numb. ?You feel faint. ?You have very bad pain in your: ?Chest. ?Belly (abdomen). ?You vomit more than once. ?You have trouble breathing. ?These symptoms may be an emergency. Get help right away. Call 911. ?Do not wait to see if the symptoms will go away. ?Do not drive yourself to the hospital. ?Summary ?Hypertension is another name for high blood pressure. ?High blood pressure forces your heart to work harder to pump blood. ?For most people, a normal blood pressure is less than 120/80. ?Making healthy choices can help lower blood pressure. If your blood pressure does not get lower with healthy choices, you may need to take medicine. ?This information is not intended to replace advice given to you by your health care provider. Make sure you discuss any questions you have with your health care provider. ?Document Revised: 02/07/2021 Document Reviewed: 02/07/2021 ?Elsevier Patient Education ? 2023 [employer] ? ?

## 2022-09-03 NOTE — Progress Notes (Signed)
Subjective:    Patient ID: Diana Rose, female    DOB: [birth year], 57 y.o.   MRN: [id number]  HPI  Patient presents to clinic today with complaint of dizziness.  She reports this started a few months ago. It is intermittent but occurs more days than night. She reports it is more a sensation of lightheadedness, not that the room is spinning.  She denies vision changes, nausea, vomiting or chest pain.  Her BP today is 144/88.  She has had vertigo in the past and she reports this feels different. She has been under a lot of stress lately, caring for your mom and mother in law.  Review of Systems     Past Medical History:  Diagnosis Date   Plantar fasciitis     Current Outpatient Medications  Medication Sig Dispense Refill   atorvastatin (LIPITOR) 10 MG tablet TAKE 1 TABLET BY MOUTH EVERY DAY 90 tablet 0   busPIRone (BUSPAR) 5 MG tablet Take 1 tablet (5 mg total) by mouth daily as needed. 90 tablet 0   meloxicam (MOBIC) 15 MG tablet Take 1 tablet by mouth daily.     mupirocin ointment (BACTROBAN) 2 % Apply 1 Application topically 2 (two) times daily. 22 g 0   Probiotic Product (PROBIOTIC BLEND PO) Take by mouth.     triamcinolone cream (KENALOG) 0.1 % Apply 1 Application topically 2 (two) times daily. 30 g 0   No current facility-administered medications for this visit.    Allergies  Allergen Reactions   Asa [Aspirin] Other (See Comments)    "burning in stomach"     Family History  Problem Relation Age of Onset   Fibromyalgia Mother    Heart attack Mother        11s   Hypertension Mother    Congestive Heart Failure Mother    Other Father        unknown medical history   Bipolar disorder Sister     Social History   Socioeconomic History   Marital status: Legally Separated    Spouse name: Not on file   Number of children: Not on file   Years of education: Not on file   Highest education level: Not on file  Occupational History   Not on file  Tobacco Use    Smoking status: Former    Types: Cigarettes   Smokeless tobacco: Never  Vaping Use   Vaping Use: Never used  Substance and Sexual Activity   Alcohol use: Yes    Comment: occassional   Drug use: Never   Sexual activity: Not on file  Other Topics Concern   Not on file  Social History Narrative   Not on file   Social Determinants of Health   Financial Resource Strain: Not on file  Food Insecurity: Not on file  Transportation Needs: Not on file  Physical Activity: Not on file  Stress: Not on file  Social Connections: Not on file  Intimate Partner Violence: Not on file     Constitutional: Denies fever, malaise, fatigue, headache or abrupt weight changes.  HEENT: Denies eye pain, eye redness, ear pain, ringing in the ears, wax buildup, runny nose, nasal congestion, bloody nose, or sore throat. Respiratory: Denies difficulty breathing, shortness of breath, cough or sputum production.   Cardiovascular: Denies chest pain, chest tightness, palpitations or swelling in the hands or feet.  Gastrointestinal: Denies abdominal pain, bloating, constipation, diarrhea or blood in the stool.  GU: Denies urgency, frequency, pain with urination,  burning sensation, blood in urine, odor or discharge. Musculoskeletal: Denies decrease in range of motion, difficulty with gait, muscle pain or joint pain and swelling.  Skin: Denies redness, rashes, lesions or ulcercations.  Neurological: Patient reports dizziness.  Denies difficulty with memory, difficulty with speech or problems with balance and coordination.  Psych: Patient reports stress.  Denies anxiety, depression, SI/HI.  No other specific complaints in a complete review of systems (except as listed in HPI above).  Objective:   Physical Exam  BP (!) 142/90 (BP Location: Left Arm, Patient Position: Sitting, Cuff Size: Normal)   Pulse 68   Temp (!) 96.6 F (35.9 C) (Temporal)   Wt 163 lb (73.9 kg)   SpO2 96%   BMI 31.83 kg/m   Wt Readings  from Last 3 Encounters:  07/07/22 164 lb (74.4 kg)  06/30/22 168 lb (76.2 kg)  03/03/22 172 lb (78 kg)    General: Appears her stated age, obese, in NAD. HEENT: Head: normal shape and size; Eyes: sclera white, no icterus, conjunctiva pink, PERRLA and EOMs intact;  Cardiovascular: Normal rate and rhythm. S1,S2 noted.  No murmur, rubs or gallops noted. No JVD or BLE edema.  Pulmonary/Chest: Normal effort and positive vesicular breath sounds. No respiratory distress. No wheezes, rales or ronchi noted.  Musculoskeletal: No difficulty with gait.  Neurological: Alert and oriented. Cranial nerves II-XII grossly intact. Coordination normal.  Psychiatric: Mood and affect normal. Mildly anxious appearing. Judgment and thought content normal.   BMET    Component Value Date/Time   NA 140 06/30/2022 0832   NA 138 02/03/2013 0911   K 4.4 06/30/2022 0832   K 3.8 02/03/2013 0911   CL 104 06/30/2022 0832   CL 104 02/03/2013 0911   CO2 28 06/30/2022 0832   CO2 30 02/03/2013 0911   GLUCOSE 101 (H) 06/30/2022 0832   GLUCOSE 90 02/03/2013 0911   BUN 16 06/30/2022 0832   BUN 13 02/03/2013 0911   CREATININE 0.79 06/30/2022 0832   CALCIUM 9.2 06/30/2022 0832   CALCIUM 9.1 02/03/2013 0911   GFRNONAA >60 02/03/2013 0911   GFRAA >60 02/03/2013 0911    Lipid Panel     Component Value Date/Time   CHOL 162 06/30/2022 0832   CHOL 244 (H) 02/03/2013 0911   TRIG 185 (H) 06/30/2022 0832   TRIG 199 02/03/2013 0911   HDL 55 06/30/2022 0832   HDL 56 02/03/2013 0911   CHOLHDL 2.9 06/30/2022 0832   VLDL 40 02/03/2013 0911   LDLCALC 79 06/30/2022 0832   LDLCALC 148 (H) 02/03/2013 0911    CBC    Component Value Date/Time   WBC 5.3 06/30/2022 0832   RBC 4.37 06/30/2022 0832   HGB 13.1 06/30/2022 0832   HGB 13.4 02/03/2013 0911   HCT 39.4 06/30/2022 0832   HCT 39.2 02/03/2013 0911   PLT 267 06/30/2022 0832   PLT 279 02/03/2013 0911   MCV 90.2 06/30/2022 0832   MCV 88 02/03/2013 0911   MCH 30.0  06/30/2022 0832   MCHC 33.2 06/30/2022 0832   RDW 12.1 06/30/2022 0832   RDW 12.7 02/03/2013 0911   LYMPHSABS 1.6 02/03/2013 0911   MONOABS 0.5 02/03/2013 0911   EOSABS 0.1 02/03/2013 0911   BASOSABS 0.0 02/03/2013 0911    Hgb A1C Lab Results  Component Value Date   HGBA1C 6.0 (H) 06/30/2022           Assessment & Plan:   Lightheadedness, HTN:  Likely due to elevated blood pressure  Rx for Lisinopril 10 mg daily Reinforced DASH diet and exercise for weight loss  RTC in 2 weeks, follow-up HTN, 4 months for your annual exam Nicki Reaper, NP

## 2022-09-10 ENCOUNTER — Other Ambulatory Visit: Payer: Self-pay | Admitting: Internal Medicine

## 2022-09-10 NOTE — Telephone Encounter (Signed)
Requested Prescriptions  Pending Prescriptions Disp Refills   atorvastatin (LIPITOR) 10 MG tablet [Pharmacy Med Name: ATORVASTATIN 10 MG TABLET] 90 tablet 0    Sig: TAKE 1 TABLET BY MOUTH EVERY DAY     Cardiovascular:  Antilipid - Statins Failed - 09/10/2022  2:26 AM      Failed - Lipid Panel in normal range within the last 12 months    Cholesterol  Date Value Ref Range Status  06/30/2022 162 <200 mg/dL Final  42/59/5638 756 (H) 0 - 200 mg/dL Final   Ldl Cholesterol, Calc  Date Value Ref Range Status  02/03/2013 148 (H) 0 - 100 mg/dL Final   LDL Cholesterol (Calc)  Date Value Ref Range Status  06/30/2022 79 mg/dL (calc) Final    Comment:    Reference range: <100 . Desirable range <100 mg/dL for primary prevention;   <70 mg/dL for patients with CHD or diabetic patients  with > or = 2 CHD risk factors. Marland Kitchen LDL-C is now calculated using the Martin-Hopkins  calculation, which is a validated novel method providing  better accuracy than the Friedewald equation in the  estimation of LDL-C.  Horald Pollen et al. Lenox Ahr. 4332;951(88): 2061-2068  (http://education.[facility].com/faq/FAQ164)    HDL Cholesterol  Date Value Ref Range Status  02/03/2013 56 40 - 60 mg/dL Final   HDL  Date Value Ref Range Status  06/30/2022 55 > OR = 50 mg/dL Final   Triglycerides  Date Value Ref Range Status  06/30/2022 185 (H) <150 mg/dL Final  41/66/0630 160 0 - 200 mg/dL Final         Passed - Patient is not pregnant      Passed - Valid encounter within last 12 months    Recent Outpatient Visits           1 week ago Primary hypertension   [place] [facility] [place], Salvadore Oxford, NP   2 months ago Mixed hyperlipidemia   [place] [facility] [place], Salvadore Oxford, NP   6 months ago Folliculitis   [place] [facility] [place], Salvadore Oxford, NP   8 months ago Encounter for general adult medical examination with abnormal findings   [place] [facility] [place], Salvadore Oxford, NP   9 months ago Chronic foot pain, right   [place] [facility] [place], Netta Neat, DO       Future Appointments             In 1 week Baity, Salvadore Oxford, NP [place] [facility]   In 3 months [place], Salvadore Oxford, NP [facility] [place]

## 2022-09-17 ENCOUNTER — Encounter: Payer: Self-pay | Admitting: Internal Medicine

## 2022-09-17 ENCOUNTER — Ambulatory Visit (INDEPENDENT_AMBULATORY_CARE_PROVIDER_SITE_OTHER): Payer: BC Managed Care – PPO | Admitting: Internal Medicine

## 2022-09-17 VITALS — BP 118/64 | HR 62 | Temp 96.6°F | Wt 164.0 lb

## 2022-09-17 DIAGNOSIS — Z6832 Body mass index (BMI) 32.0-32.9, adult: Secondary | ICD-10-CM | POA: Diagnosis not present

## 2022-09-17 DIAGNOSIS — I10 Essential (primary) hypertension: Secondary | ICD-10-CM | POA: Diagnosis not present

## 2022-09-17 DIAGNOSIS — E6609 Other obesity due to excess calories: Secondary | ICD-10-CM

## 2022-09-17 MED ORDER — BUSPIRONE HCL 5 MG PO TABS: 5.0000 mg | ORAL_TABLET | Freq: Every day | ORAL | 0 refills | Status: DC | PRN

## 2022-09-17 NOTE — Assessment & Plan Note (Signed)
Controlled on lisinopril Reinforced DASH diet and exercise for weight loss 

## 2022-09-17 NOTE — Assessment & Plan Note (Signed)
Encourage diet and exercise for weight loss 

## 2022-09-17 NOTE — Progress Notes (Signed)
Subjective:    Patient ID: Diana Rose, female    DOB: 02-19-1966, 57 y.o.   MRN: [id number]  HPI  Patient presents to clinic today for follow-up of HTN.  At her last visit she was having frequent headaches and lightheadedness.  She was started on Lisinopril 10 mg daily.  She has been taking the medication as prescribed.  Her BP today is 118/64. She is no longer having headaches or lightheadedness. There is no ECG on file.  Review of Systems     Past Medical History:  Diagnosis Date   Plantar fasciitis     Current Outpatient Medications  Medication Sig Dispense Refill   atorvastatin (LIPITOR) 10 MG tablet TAKE 1 TABLET BY MOUTH EVERY DAY 90 tablet 1   busPIRone (BUSPAR) 5 MG tablet Take 1 tablet (5 mg total) by mouth daily as needed. 90 tablet 0   lisinopril (ZESTRIL) 10 MG tablet Take 1 tablet (10 mg total) by mouth daily. 90 tablet 0   meloxicam (MOBIC) 15 MG tablet Take 1 tablet by mouth daily.     mupirocin ointment (BACTROBAN) 2 % Apply 1 Application topically 2 (two) times daily. 22 g 0   Probiotic Product (PROBIOTIC BLEND PO) Take by mouth.     triamcinolone cream (KENALOG) 0.1 % Apply 1 Application topically 2 (two) times daily. 30 g 0   No current facility-administered medications for this visit.    Allergies  Allergen Reactions   Asa [Aspirin] Other (See Comments)    "burning in stomach"     Family History  Problem Relation Age of Onset   Fibromyalgia Mother    Heart attack Mother        72s   Hypertension Mother    Congestive Heart Failure Mother    Other Father        unknown medical history   Bipolar disorder Sister     Social History   Socioeconomic History   Marital status: Legally Separated    Spouse name: Not on file   Number of children: Not on file   Years of education: Not on file   Highest education level: Not on file  Occupational History   Not on file  Tobacco Use   Smoking status: Former    Types: Cigarettes   Smokeless  tobacco: Never  Vaping Use   Vaping Use: Never used  Substance and Sexual Activity   Alcohol use: Yes    Comment: occassional   Drug use: Never   Sexual activity: Not on file  Other Topics Concern   Not on file  Social History Narrative   Not on file   Social Determinants of Health   Financial Resource Strain: Not on file  Food Insecurity: Not on file  Transportation Needs: Not on file  Physical Activity: Not on file  Stress: Not on file  Social Connections: Not on file  Intimate Partner Violence: Not on file     Constitutional: Denies fever, malaise, fatigue, headache or abrupt weight changes.  HEENT: Denies eye pain, eye redness, ear pain, ringing in the ears, wax buildup, runny nose, nasal congestion, bloody nose, or sore throat. Respiratory: Denies difficulty breathing, shortness of breath, cough or sputum production.   Cardiovascular: Denies chest pain, chest tightness, palpitations or swelling in the hands or feet.  Gastrointestinal: Denies abdominal pain, bloating, constipation, diarrhea or blood in the stool.  GU: Denies urgency, frequency, pain with urination, burning sensation, blood in urine, odor or discharge. Musculoskeletal: Patient reports  joint pain.  Denies decrease in range of motion, difficulty with gait, muscle pain or joint swelling.  Skin: Denies redness, rashes, lesions or ulcercations.  Neurological: Denies dizziness, difficulty with memory, difficulty with speech or problems with balance and coordination.  Psych: Patient has a history of anxiety.  Denies depression, SI/HI.  No other specific complaints in a complete review of systems (except as listed in HPI above).  Objective:   Physical Exam BP 118/64 (BP Location: Right Arm, Patient Position: Sitting, Cuff Size: Normal)   Pulse 62   Temp (!) 96.6 F (35.9 C) (Temporal)   Wt 164 lb (74.4 kg)   SpO2 97%   BMI 32.03 kg/m   Wt Readings from Last 3 Encounters:  09/03/22 163 lb (73.9 kg)   07/07/22 164 lb (74.4 kg)  06/30/22 168 lb (76.2 kg)    General: Appears her stated age, obese, in NAD.  Cardiovascular: Normal rate. No JVD or BLE edema.  Pulmonary/Chest: Normal effort and positive vesicular breath sounds. No respiratory distress. No wheezes, rales or ronchi noted.  Neurological: Alert and oriented.  Coordination normal.     BMET    Component Value Date/Time   NA 140 06/30/2022 0832   NA 138 02/03/2013 0911   K 4.4 06/30/2022 0832   K 3.8 02/03/2013 0911   CL 104 06/30/2022 0832   CL 104 02/03/2013 0911   CO2 28 06/30/2022 0832   CO2 30 02/03/2013 0911   GLUCOSE 101 (H) 06/30/2022 0832   GLUCOSE 90 02/03/2013 0911   BUN 16 06/30/2022 0832   BUN 13 02/03/2013 0911   CREATININE 0.79 06/30/2022 0832   CALCIUM 9.2 06/30/2022 0832   CALCIUM 9.1 02/03/2013 0911   GFRNONAA >60 02/03/2013 0911   GFRAA >60 02/03/2013 0911    Lipid Panel     Component Value Date/Time   CHOL 162 06/30/2022 0832   CHOL 244 (H) 02/03/2013 0911   TRIG 185 (H) 06/30/2022 0832   TRIG 199 02/03/2013 0911   HDL 55 06/30/2022 0832   HDL 56 02/03/2013 0911   CHOLHDL 2.9 06/30/2022 0832   VLDL 40 02/03/2013 0911   LDLCALC 79 06/30/2022 0832   LDLCALC 148 (H) 02/03/2013 0911    CBC    Component Value Date/Time   WBC 5.3 06/30/2022 0832   RBC 4.37 06/30/2022 0832   HGB 13.1 06/30/2022 0832   HGB 13.4 02/03/2013 0911   HCT 39.4 06/30/2022 0832   HCT 39.2 02/03/2013 0911   PLT 267 06/30/2022 0832   PLT 279 02/03/2013 0911   MCV 90.2 06/30/2022 0832   MCV 88 02/03/2013 0911   MCH 30.0 06/30/2022 0832   MCHC 33.2 06/30/2022 0832   RDW 12.1 06/30/2022 0832   RDW 12.7 02/03/2013 0911   LYMPHSABS 1.6 02/03/2013 0911   MONOABS 0.5 02/03/2013 0911   EOSABS 0.1 02/03/2013 0911   BASOSABS 0.0 02/03/2013 0911    Hgb A1C Lab Results  Component Value Date   HGBA1C 6.0 (H) 06/30/2022            Assessment & Plan:      RTC in 4 months for annual exam Nicki Reaper, NP

## 2022-09-17 NOTE — Patient Instructions (Signed)

## 2022-09-18 ENCOUNTER — Telehealth: Payer: Self-pay | Admitting: Podiatry

## 2022-09-18 NOTE — Telephone Encounter (Signed)
Lmom for patient to schedule picking up orthotics -currently in [place] will ship to [place] when scheduled    Balance is 91.50

## 2022-09-26 ENCOUNTER — Ambulatory Visit (INDEPENDENT_AMBULATORY_CARE_PROVIDER_SITE_OTHER): Payer: BC Managed Care – PPO

## 2022-09-26 DIAGNOSIS — M7751 Other enthesopathy of right foot: Secondary | ICD-10-CM

## 2022-09-26 NOTE — Progress Notes (Unsigned)
Patient presents today to pick up custom molded foot orthotics recommended by Dr. EVANS.   Orthotics were dispensed and fit was satisfactory. Reviewed instructions for break-in and wear. Written instructions given to patient.  Patient will follow up as needed.   

## 2022-10-06 ENCOUNTER — Encounter: Payer: Self-pay | Admitting: Internal Medicine

## 2022-10-28 ENCOUNTER — Encounter: Payer: Self-pay | Admitting: Internal Medicine

## 2022-10-28 ENCOUNTER — Other Ambulatory Visit: Payer: Self-pay | Admitting: Internal Medicine

## 2022-10-28 MED ORDER — FLUTICASONE PROPIONATE 50 MCG/ACT NA SUSP: 1.0000 | Freq: Every day | NASAL | 0 refills | Status: AC

## 2022-10-28 NOTE — Telephone Encounter (Signed)
Requested medication (s) are due for refill today - yes  Requested medication (s) are on the active medication list -yes  Future visit scheduled -yes  Last refill: 06/30/22 30g  Notes to clinic: non delegated Rx  Requested Prescriptions  Pending Prescriptions Disp Refills   triamcinolone cream (KENALOG) 0.1 % [Pharmacy Med Name: TRIAMCINOLONE 0.1% CREAM] 30 g 0    Sig: APPLY TO AFFECTED AREA TWICE A DAY     Not Delegated - Dermatology:  Corticosteroids Failed - 10/28/2022 11:47 AM      Failed - This refill cannot be delegated      Passed - Valid encounter within last 12 months    Recent Outpatient Visits           1 month ago Primary hypertension   [place] [facility] [place], Salvadore Oxford, NP   1 month ago Primary hypertension   [place] [facility] [place], Salvadore Oxford, NP   4 months ago Mixed hyperlipidemia   [place] [facility] [place], Salvadore Oxford, NP   7 months ago Folliculitis   [place] [facility] [place], Salvadore Oxford, NP   10 months ago Encounter for general adult medical examination with abnormal findings   [place] [facility] [place], Salvadore Oxford, NP       Future Appointments             In 2 months Baity, Salvadore Oxford, NP [place] [facility], [facility]               Requested Prescriptions  Pending Prescriptions Disp Refills   triamcinolone cream (KENALOG) 0.1 % [Pharmacy Med Name: TRIAMCINOLONE 0.1% CREAM] 30 g 0    Sig: APPLY TO AFFECTED AREA TWICE A DAY     Not Delegated - Dermatology:  Corticosteroids Failed - 10/28/2022 11:47 AM      Failed - This refill cannot be delegated      Passed - Valid encounter within last 12 months    Recent Outpatient Visits           1 month ago Primary hypertension   [place] [facility] [place], Salvadore Oxford, NP   1 month ago Primary hypertension   [place] [facility] [place], Salvadore Oxford, NP   4  months ago Mixed hyperlipidemia   [place] [place], Salvadore Oxford, NP   7 months ago Folliculitis   [place] [facility] [place], Salvadore Oxford, NP   10 months ago Encounter for general adult medical examination with abnormal findings   [place] [facility] [place], Salvadore Oxford, NP       Future Appointments             In 2 months Baity, Salvadore Oxford, NP [place] [facility], [facility]

## 2022-11-27 ENCOUNTER — Other Ambulatory Visit: Payer: Self-pay | Admitting: Internal Medicine

## 2022-11-28 NOTE — Telephone Encounter (Signed)
Requested Prescriptions  Pending Prescriptions Disp Refills   lisinopril (ZESTRIL) 10 MG tablet [Pharmacy Med Name: LISINOPRIL 10 MG TABLET] 90 tablet 0    Sig: TAKE 1 TABLET BY MOUTH EVERY DAY     Cardiovascular:  ACE Inhibitors Passed - 11/27/2022  6:37 PM      Passed - Cr in normal range and within 180 days    Creat  Date Value Ref Range Status  06/30/2022 0.79 0.50 - 1.03 mg/dL Final         Passed - K in normal range and within 180 days    Potassium  Date Value Ref Range Status  06/30/2022 4.4 3.5 - 5.3 mmol/L Final  02/03/2013 3.8 3.5 - 5.1 mmol/L Final         Passed - Patient is not pregnant      Passed - Last BP in normal range    BP Readings from Last 1 Encounters:  09/17/22 118/64         Passed - Valid encounter within last 6 months    Recent Outpatient Visits           2 months ago Primary hypertension   [place] [facility] [place], Salvadore Oxford, NP   2 months ago Primary hypertension   [place] [facility] [place], Salvadore Oxford, NP   5 months ago Mixed hyperlipidemia   [place] [facility] [place], Salvadore Oxford, NP   9 months ago Folliculitis   [place] [facility] [place], Salvadore Oxford, NP   11 months ago Encounter for general adult medical examination with abnormal findings   [place] [facility] [place], Salvadore Oxford, NP       Future Appointments             In 1 month Baity, Salvadore Oxford, NP [place] [facility], [facility]             busPIRone (BUSPAR) 5 MG tablet [Pharmacy Med Name: BUSPIRONE HCL 5 MG TABLET] 90 tablet 0    Sig: TAKE 1 TABLET BY MOUTH DAILY AS NEEDED.     Psychiatry: Anxiolytics/Hypnotics - Non-controlled Passed - 11/27/2022  6:37 PM      Passed - Valid encounter within last 12 months    Recent Outpatient Visits           2 months ago Primary hypertension   [place] [facility] [place], Salvadore Oxford, NP   2 months ago Primary  hypertension   [place] [facility] [place], Salvadore Oxford, NP   5 months ago Mixed hyperlipidemia   [place] [facility] [place], Salvadore Oxford, NP   9 months ago Folliculitis   [place] [facility] [place], NP   11 months ago Encounter for general adult medical examination with abnormal findings   [place] [facility] [place], Salvadore Oxford, NP       Future Appointments             In 1 month Baity, Salvadore Oxford, NP [place] [facility], [facility]

## 2022-11-30 ENCOUNTER — Other Ambulatory Visit: Payer: Self-pay | Admitting: Internal Medicine

## 2022-12-02 NOTE — Telephone Encounter (Signed)
Requested Prescriptions  Refused Prescriptions Disp Refills   busPIRone (BUSPAR) 5 MG tablet [Pharmacy Med Name: BUSPIRONE HCL 5 MG TABLET] 90 tablet 0    Sig: TAKE 1 TABLET BY MOUTH DAILY AS NEEDED.     Psychiatry: Anxiolytics/Hypnotics - Non-controlled Passed - 11/30/2022  2:28 PM      Passed - Valid encounter within last 12 months    Recent Outpatient Visits           2 months ago Primary hypertension   [place] [facility] [place], Salvadore Oxford, NP   3 months ago Primary hypertension   [place] [facility] [place], Salvadore Oxford, NP   5 months ago Mixed hyperlipidemia   [place] [facility] [place], [place], NP   9 months ago Folliculitis   [place] [facility] [place], Salvadore Oxford, NP   11 months ago Encounter for general adult medical examination with abnormal findings   [place] [facility] [place], Salvadore Oxford, NP       Future Appointments             In 4 weeks [place], Salvadore Oxford, NP [place] [facility], [facility]             lisinopril (ZESTRIL) 10 MG tablet [Pharmacy Med Name: LISINOPRIL 10 MG TABLET] 90 tablet 0    Sig: TAKE 1 TABLET BY MOUTH EVERY DAY     Cardiovascular:  ACE Inhibitors Passed - 11/30/2022  2:28 PM      Passed - Cr in normal range and within 180 days    Creat  Date Value Ref Range Status  06/30/2022 0.79 0.50 - 1.03 mg/dL Final         Passed - K in normal range and within 180 days    Potassium  Date Value Ref Range Status  06/30/2022 4.4 3.5 - 5.3 mmol/L Final  02/03/2013 3.8 3.5 - 5.1 mmol/L Final         Passed - Patient is not pregnant      Passed - Last BP in normal range    BP Readings from Last 1 Encounters:  09/17/22 118/64         Passed - Valid encounter within last 6 months    Recent Outpatient Visits           2 months ago Primary hypertension   [place] [facility] [place], Salvadore Oxford, NP   3 months ago Primary  hypertension   [place] [facility] [place], Salvadore Oxford, NP   5 months ago Mixed hyperlipidemia   [place] [facility] [place], Salvadore Oxford, NP   9 months ago Folliculitis   [place] [facility] [place], Salvadore Oxford, NP   11 months ago Encounter for general adult medical examination with abnormal findings   [place] [facility] [place], Salvadore Oxford, NP       Future Appointments             In 4 weeks [place], Salvadore Oxford, NP  [facility], [facility]

## 2022-12-09 ENCOUNTER — Other Ambulatory Visit: Payer: Self-pay | Admitting: Internal Medicine

## 2022-12-09 DIAGNOSIS — Z1231 Encounter for screening mammogram for malignant neoplasm of breast: Secondary | ICD-10-CM

## 2022-12-16 DIAGNOSIS — G4733 Obstructive sleep apnea (adult) (pediatric): Secondary | ICD-10-CM | POA: Diagnosis not present

## 2022-12-18 ENCOUNTER — Other Ambulatory Visit: Payer: Self-pay | Admitting: Podiatry

## 2022-12-18 DIAGNOSIS — R52 Pain, unspecified: Secondary | ICD-10-CM

## 2022-12-18 DIAGNOSIS — M779 Enthesopathy, unspecified: Secondary | ICD-10-CM

## 2022-12-18 DIAGNOSIS — M7751 Other enthesopathy of right foot: Secondary | ICD-10-CM

## 2022-12-29 ENCOUNTER — Encounter: Payer: BC Managed Care – PPO | Admitting: Internal Medicine

## 2022-12-31 ENCOUNTER — Encounter: Payer: Self-pay | Admitting: Internal Medicine

## 2022-12-31 ENCOUNTER — Ambulatory Visit (INDEPENDENT_AMBULATORY_CARE_PROVIDER_SITE_OTHER): Payer: BC Managed Care – PPO | Admitting: Internal Medicine

## 2022-12-31 VITALS — BP 112/68 | HR 65 | Temp 95.3°F | Ht 60.0 in | Wt 163.0 lb

## 2022-12-31 DIAGNOSIS — Z23 Encounter for immunization: Secondary | ICD-10-CM | POA: Diagnosis not present

## 2022-12-31 DIAGNOSIS — E782 Mixed hyperlipidemia: Secondary | ICD-10-CM | POA: Diagnosis not present

## 2022-12-31 DIAGNOSIS — Z0001 Encounter for general adult medical examination with abnormal findings: Secondary | ICD-10-CM

## 2022-12-31 DIAGNOSIS — R7303 Prediabetes: Secondary | ICD-10-CM

## 2022-12-31 DIAGNOSIS — Z6831 Body mass index (BMI) 31.0-31.9, adult: Secondary | ICD-10-CM

## 2022-12-31 DIAGNOSIS — E6609 Other obesity due to excess calories: Secondary | ICD-10-CM

## 2022-12-31 NOTE — Progress Notes (Signed)
Subjective:    Patient ID: Diana Rose, female    DOB: 03-17-1966, 57 y.o.   MRN: [id number]  HPI  Patient presents to clinic today for her annual exam.  Flu: 02/2022 Tetanus: 06/2021 COVID: X 2 Shingrix: Never Pap smear: 12/2021, ASCUS, negative HPV, repeat in 3 years Mammogram: 01/2022 Colon screening: 09/2017 Vision screening: annually Dentist: biannually  Diet: She does eat meat. She consumes fruits and veggies. She does eat some fried foods. She drinks mostly water and coffee. Exercise: stretching 1 x week  Review of Systems     Past Medical History:  Diagnosis Date   Plantar fasciitis     Current Outpatient Medications  Medication Sig Dispense Refill   atorvastatin (LIPITOR) 10 MG tablet TAKE 1 TABLET BY MOUTH EVERY DAY 90 tablet 1   busPIRone (BUSPAR) 5 MG tablet TAKE 1 TABLET BY MOUTH DAILY AS NEEDED. 90 tablet 0   fluticasone (FLONASE) 50 MCG/ACT nasal spray Place 1 spray into both nostrils daily. 16 g 0   lisinopril (ZESTRIL) 10 MG tablet TAKE 1 TABLET BY MOUTH EVERY DAY 90 tablet 0   meloxicam (MOBIC) 15 MG tablet Take 1 tablet by mouth daily.     mupirocin ointment (BACTROBAN) 2 % Apply 1 Application topically 2 (two) times daily. 22 g 0   Probiotic Product (PROBIOTIC BLEND PO) Take by mouth.     triamcinolone cream (KENALOG) 0.1 % APPLY TO AFFECTED AREA TWICE A DAY 30 g 0   No current facility-administered medications for this visit.    Allergies  Allergen Reactions   Asa [Aspirin] Other (See Comments)    "burning in stomach"     Family History  Problem Relation Age of Onset   Fibromyalgia Mother    Heart attack Mother        86s   Hypertension Mother    Congestive Heart Failure Mother    Other Father        unknown medical history   Bipolar disorder Sister     Social History   Socioeconomic History   Marital status: Legally Separated    Spouse name: Not on file   Number of children: Not on file   Years of education: Not on file    Highest education level: Not on file  Occupational History   Not on file  Tobacco Use   Smoking status: Former    Types: Cigarettes   Smokeless tobacco: Never  Vaping Use   Vaping status: Never Used  Substance and Sexual Activity   Alcohol use: Yes    Comment: occassional   Drug use: Never   Sexual activity: Not on file  Other Topics Concern   Not on file  Social History Narrative   Not on file   Social Determinants of Health   Financial Resource Strain: Not on file  Food Insecurity: Not on file  Transportation Needs: Not on file  Physical Activity: Not on file  Stress: Not on file  Social Connections: Not on file  Intimate Partner Violence: Not on file     Constitutional: Denies fever, malaise, fatigue, headache or abrupt weight changes.  HEENT: Denies eye pain, eye redness, ear pain, ringing in the ears, wax buildup, runny nose, nasal congestion, bloody nose, or sore throat. Respiratory: Denies difficulty breathing, shortness of breath, cough or sputum production.   Cardiovascular: Pt reports chest tightness (anxiety related). Denies chest pain, palpitations or swelling in the hands or feet.  Gastrointestinal: Pt reports intermittent reflux. Denies abdominal  pain, bloating, constipation, diarrhea or blood in the stool.  GU: Denies urgency, frequency, pain with urination, burning sensation, blood in urine, odor or discharge. Musculoskeletal: Patient reports knee pain.  Denies decrease in range of motion, difficulty with gait, muscle pain or joint swelling.  Skin: Denies redness, rashes, lesions or ulcercations.  Neurological: Denies dizziness, difficulty with memory, difficulty with speech or problems with balance and coordination.  Psych: Patient has a history of anxiety.  Denies depression, SI/HI.  No other specific complaints in a complete review of systems (except as listed in HPI above).  Objective:   Physical Exam  BP 112/68 (BP Location: Right Arm, Patient  Position: Sitting, Cuff Size: Normal)   Pulse 65   Temp (!) 95.3 F (35.2 C) (Temporal)   Ht 5' (1.524 m)   Wt 163 lb (73.9 kg)   SpO2 97%   BMI 31.83 kg/m   Wt Readings from Last 3 Encounters:  09/17/22 164 lb (74.4 kg)  09/03/22 163 lb (73.9 kg)  07/07/22 164 lb (74.4 kg)    General: Appears her stated age obese, in NAD. Skin: Warm, dry and intact.  HEENT: Head: normal shape and size; Eyes: sclera white, no icterus, conjunctiva pink, PERRLA and EOMs intact;  Neck:  Neck supple, trachea midline. No masses, lumps or thyromegaly present.  Cardiovascular: Normal rate and rhythm. S1,S2 noted.  No murmur, rubs or gallops noted. No JVD or BLE edema. No carotid bruits noted. Pulmonary/Chest: Normal effort and positive vesicular breath sounds. No respiratory distress. No wheezes, rales or ronchi noted.  Abdomen: Normal bowel sounds.  Musculoskeletal: Strength 5/5 BUE/BLE.  No difficulty with gait.  Neurological: Alert and oriented. Cranial nerves II-XII grossly intact. Coordination normal.  Psychiatric: Mood and affect normal. Behavior is normal. Judgment and thought content normal.    BMET    Component Value Date/Time   NA 140 06/30/2022 0832   NA 138 02/03/2013 0911   K 4.4 06/30/2022 0832   K 3.8 02/03/2013 0911   CL 104 06/30/2022 0832   CL 104 02/03/2013 0911   CO2 28 06/30/2022 0832   CO2 30 02/03/2013 0911   GLUCOSE 101 (H) 06/30/2022 0832   GLUCOSE 90 02/03/2013 0911   BUN 16 06/30/2022 0832   BUN 13 02/03/2013 0911   CREATININE 0.79 06/30/2022 0832   CALCIUM 9.2 06/30/2022 0832   CALCIUM 9.1 02/03/2013 0911   GFRNONAA >60 02/03/2013 0911   GFRAA >60 02/03/2013 0911    Lipid Panel     Component Value Date/Time   CHOL 162 06/30/2022 0832   CHOL 244 (H) 02/03/2013 0911   TRIG 185 (H) 06/30/2022 0832   TRIG 199 02/03/2013 0911   HDL 55 06/30/2022 0832   HDL 56 02/03/2013 0911   CHOLHDL 2.9 06/30/2022 0832   VLDL 40 02/03/2013 0911   LDLCALC 79 06/30/2022  0832   LDLCALC 148 (H) 02/03/2013 0911    CBC    Component Value Date/Time   WBC 5.3 06/30/2022 0832   RBC 4.37 06/30/2022 0832   HGB 13.1 06/30/2022 0832   HGB 13.4 02/03/2013 0911   HCT 39.4 06/30/2022 0832   HCT 39.2 02/03/2013 0911   PLT 267 06/30/2022 0832   PLT 279 02/03/2013 0911   MCV 90.2 06/30/2022 0832   MCV 88 02/03/2013 0911   MCH 30.0 06/30/2022 0832   MCHC 33.2 06/30/2022 0832   RDW 12.1 06/30/2022 0832   RDW 12.7 02/03/2013 0911   LYMPHSABS 1.6 02/03/2013 0911   MONOABS  0.5 02/03/2013 0911   EOSABS 0.1 02/03/2013 0911   BASOSABS 0.0 02/03/2013 0911    Hgb A1C Lab Results  Component Value Date   HGBA1C 6.0 (H) 06/30/2022           Assessment & Plan:   Preventative health maintenance:  Encouraged her to get a flu shot in the fall Tetanus UTD Encouraged her to get her COVID booster Shingrix today Pap smear UTD Mammogram has already been scheduled Colon screening UTD Encouraged her to consume a balanced diet and exercise regimen Advised her to see an eye doctor and dentist annually We will check CBC, c-Met, lipid, A1c today  RTC in 6 months, follow-up chronic conditions Nicki Reaper, NP

## 2022-12-31 NOTE — Assessment & Plan Note (Signed)
Encourage diet and exercise for weight loss 

## 2022-12-31 NOTE — Patient Instructions (Signed)

## 2023-01-01 LAB — COMPLETE METABOLIC PANEL WITH GFR
AG Ratio: 1.7 (calc) (ref 1.0–2.5)
ALT: 17 U/L (ref 6–29)
AST: 17 U/L (ref 10–35)
Albumin: 4.5 g/dL (ref 3.6–5.1)
Alkaline phosphatase (APISO): 93 U/L (ref 37–153)
BUN: 19 mg/dL (ref 7–25)
CO2: 28 mmol/L (ref 20–32)
Calcium: 9.8 mg/dL (ref 8.6–10.4)
Chloride: 104 mmol/L (ref 98–110)
Creat: 0.89 mg/dL (ref 0.50–1.03)
Globulin: 2.6 g/dL (ref 1.9–3.7)
Glucose, Bld: 105 mg/dL — ABNORMAL HIGH (ref 65–99)
Potassium: 4.6 mmol/L (ref 3.5–5.3)
Sodium: 139 mmol/L (ref 135–146)
Total Bilirubin: 0.4 mg/dL (ref 0.2–1.2)
Total Protein: 7.1 g/dL (ref 6.1–8.1)
eGFR: 76 mL/min/{1.73_m2} (ref >=60)

## 2023-01-01 LAB — CBC
HCT: 38.9 % (ref 35.0–45.0)
Hemoglobin: 12.6 g/dL (ref 11.7–15.5)
MCH: 29.8 pg (ref 27.0–33.0)
MCHC: 32.4 g/dL (ref 32.0–36.0)
MCV: 92 fL (ref 80.0–100.0)
MPV: 9.4 fL (ref 7.5–12.5)
Platelets: 255 10*3/uL (ref 140–400)
RBC: 4.23 10*6/uL (ref 3.80–5.10)
RDW: 12.6 % (ref 11.0–15.0)
WBC: 6.1 10*3/uL (ref 3.8–10.8)

## 2023-01-01 LAB — LIPID PANEL
Cholesterol: 188 mg/dL (ref <200)
HDL: 61 mg/dL (ref >=50)
LDL Cholesterol (Calc): 98 mg/dL
Non-HDL Cholesterol (Calc): 127 mg/dL (ref <130)
Total CHOL/HDL Ratio: 3.1 (calc) (ref <5.0)
Triglycerides: 202 mg/dL — ABNORMAL HIGH (ref <150)

## 2023-01-01 LAB — HEMOGLOBIN A1C
Hgb A1c MFr Bld: 5.7 %{Hb} — ABNORMAL HIGH (ref <5.7)
Mean Plasma Glucose: 117 mg/dL
eAG (mmol/L): 6.5 mmol/L

## 2023-01-26 ENCOUNTER — Ambulatory Visit: Payer: BC Managed Care – PPO

## 2023-02-02 NOTE — Progress Notes (Signed)
Seen by casting department

## 2023-02-09 ENCOUNTER — Ambulatory Visit: Payer: BC Managed Care – PPO

## 2023-02-11 ENCOUNTER — Encounter: Payer: Self-pay | Admitting: Internal Medicine

## 2023-02-18 ENCOUNTER — Ambulatory Visit: Payer: BC Managed Care – PPO | Admitting: Internal Medicine

## 2023-02-18 VITALS — BP 116/78 | HR 64 | Ht 60.0 in | Wt 162.0 lb

## 2023-02-18 DIAGNOSIS — M7632 Iliotibial band syndrome, left leg: Secondary | ICD-10-CM | POA: Diagnosis not present

## 2023-02-18 MED ORDER — PREDNISONE 10 MG PO TABS
ORAL_TABLET | ORAL | 0 refills | Status: DC
Start: 2023-02-18 — End: 2023-03-24

## 2023-02-18 NOTE — Patient Instructions (Signed)
Iliotibial Band Syndrome Rehab Ask your health care provider which exercises are safe for you. Do exercises exactly as told by your provider and adjust them as told. It's normal to feel mild stretching, pulling, tightness, or discomfort as you do these exercises. Stop right away if you feel sudden pain or your pain gets a lot worse. Do not begin these exercises until told by your provider. Stretching and range-of-motion exercises These exercises warm up your muscles and joints. They also improve the movement and flexibility of your hip and pelvis. Quadriceps stretch, prone  Lie face down (prone) on a firm surface like a bed or padded floor. Bend your left / right knee. Reach back to hold your ankle or pant leg. If you can't reach your ankle or pant leg, use a belt looped around your foot and grab the belt instead. Gently pull your heel toward your butt. Your knee should not slide out to the side. You should feel a stretch in the front of your thigh and knee, also called the quadriceps. Hold this position for __________ seconds. Repeat __________ times. Complete this exercise __________ times a day. Iliotibial band stretch The iliotibial band is a strip of tissue that runs along the outside of your hip down to your knee. Lie on your side with your left / right leg on top. Bend both knees and grab your left / right ankle. Stretch out your bottom arm to help you balance. Slowly bring your top knee back so your thigh goes behind your back. Slowly lower your top leg toward the floor until you feel a gentle stretch on the outside of your left / right hip and thigh. If you don't feel a stretch and your knee won't go farther, place the heel of your other foot on top of your knee and pull your knee down toward the floor with your foot. Hold this position for __________ seconds. Repeat __________ times. Complete this exercise __________ times a day. Strengthening exercises These exercises build strength  and endurance in your hip and pelvis. Endurance means your muscles can keep working even when they're tired. Straight leg raises, side-lying This exercise strengthens the muscles that rotate the leg at the hip and move it away from your body. These muscles are called hip abductors. Lie on your side with your left / right leg on top. Lie so your head, shoulder, hip, and knee line up. You can bend your bottom knee to help you balance. Roll your hips slightly forward so they're stacked directly over each other. Your left / right knee should face forward. Tense the muscles in your outer thigh and hip. Lift your top leg 4-6 inches (10-15 cm) off the ground. Hold this position for __________ seconds. Slowly lower your leg back down to the starting position. Let your muscles fully relax before doing this exercise again. Repeat __________ times. Complete this exercise __________ times a day. Leg raises, prone This exercise strengthens the muscles that move the hips backward. These muscles are called hip extensors. Lie face down (prone) on your bed or a firm surface. You can put a pillow under your hips for comfort and to support your lower back. Bend your left / right knee so your foot points straight up toward the ceiling. Keep the other leg straight and behind you. Squeeze your butt muscles. Lift your left / right thigh off the firm surface. Do not let your back arch. Tense your thigh muscle as hard as you can without having  more knee pain. Hold this position for __________ seconds. Slowly lower your leg to the starting position. Allow your leg to relax all the way. Repeat __________ times. Complete this exercise __________ times a day. Hip hike  Stand sideways on a bottom step. Place your feet so that your left / right leg is on the step, and the other foot is hanging off the side. If you need support for balance, hold onto a railing or wall. Keep your knees straight and your abdomen square,  meaning your hips are level. Then, lift your left / right hip up toward the ceiling. Slowly let your leg that's hanging off the step lower towards the floor. Your foot should get closer to the ground. Do not lean or bend your knees during this movement. Repeat __________ times. Complete this exercise __________ times a day. This information is not intended to replace advice given to you by your health care provider. Make sure you discuss any questions you have with your health care provider. Document Revised: 07/04/2022 Document Reviewed: 07/04/2022 Elsevier Patient Education  2024 [employer].

## 2023-02-18 NOTE — Progress Notes (Signed)
Subjective:    Patient ID: Diana Rose, female    DOB: [birth year], 57 y.o.   MRN: [id number]  HPI  Discussed the use of AI scribe software for clinical note transcription with the patient, who gave verbal consent to proceed.  The patient presents with a year-long history of sharp, stabbing pain in the left outer thigh, which is particularly severe in the mornings and often disrupts sleep. The pain tends to ease into a dull ache as the day progresses but never completely resolves. The patient denies any associated numbness, tingling, or weakness in the leg.  In addition to the thigh pain, the patient also reports tenderness in the lower back, distinct from the thigh pain. There is no history of injury to the area. The patient has been managing the pain with meloxicam for inflammation and occasional use of Tylenol, with minimal relief. Heat therapy has also been employed with limited success.  The patient has been attending a stretching program at a local facility for the past two months. Despite these efforts, the pain persists and has led to concerns about potential underlying conditions, given a family history of fibromyalgia and late-onset multiple sclerosis.       Review of Systems     Past Medical History:  Diagnosis Date   Plantar fasciitis     Current Outpatient Medications  Medication Sig Dispense Refill   atorvastatin (LIPITOR) 10 MG tablet TAKE 1 TABLET BY MOUTH EVERY DAY 90 tablet 1   busPIRone (BUSPAR) 5 MG tablet TAKE 1 TABLET BY MOUTH DAILY AS NEEDED. 90 tablet 0   fluticasone (FLONASE) 50 MCG/ACT nasal spray Place 1 spray into both nostrils daily. 16 g 0   lisinopril (ZESTRIL) 10 MG tablet TAKE 1 TABLET BY MOUTH EVERY DAY 90 tablet 0   meloxicam (MOBIC) 15 MG tablet Take 1 tablet by mouth daily.     mupirocin ointment (BACTROBAN) 2 % Apply 1 Application topically 2 (two) times daily. 22 g 0   Probiotic Product (PROBIOTIC BLEND PO) Take by mouth.     triamcinolone  cream (KENALOG) 0.1 % APPLY TO AFFECTED AREA TWICE A DAY 30 g 0   No current facility-administered medications for this visit.    Allergies  Allergen Reactions   Asa [Aspirin] Other (See Comments)    "burning in stomach"     Family History  Problem Relation Age of Onset   Fibromyalgia Mother    Heart attack Mother        82s   Hypertension Mother    Congestive Heart Failure Mother    Other Father        unknown medical history   Bipolar disorder Sister     Social History   Socioeconomic History   Marital status: Legally Separated    Spouse name: Not on file   Number of children: Not on file   Years of education: Not on file   Highest education level: Not on file  Occupational History   Not on file  Tobacco Use   Smoking status: Former    Types: Cigarettes   Smokeless tobacco: Never  Vaping Use   Vaping status: Never Used  Substance and Sexual Activity   Alcohol use: Yes    Comment: occassional   Drug use: Never   Sexual activity: Not on file  Other Topics Concern   Not on file  Social History Narrative   Not on file   Social Determinants of Health   Financial Resource Strain:  Not on file  Food Insecurity: Not on file  Transportation Needs: Not on file  Physical Activity: Not on file  Stress: Not on file  Social Connections: Not on file  Intimate Partner Violence: Not on file     Constitutional: Denies fever, malaise, fatigue, headache or abrupt weight changes.  Respiratory: Denies difficulty breathing, shortness of breath, cough or sputum production.   Cardiovascular: Denies chest pain, chest tightness, palpitations or swelling in the hands or feet.  Gastrointestinal: Denies abdominal pain, bloating, constipation, diarrhea or blood in the stool.  GU: Denies urgency, frequency, pain with urination, burning sensation, blood in urine, odor or discharge. Musculoskeletal: Pt reports intermittent low back pain, left thigh pain. Denies decrease in range of  motion, difficulty with gait, or joint swelling.  Skin: Denies redness, rashes, lesions or ulcercations.  Neurological: Denies numbness, tingling, weakness or problems with balance and coordination.    No other specific complaints in a complete review of systems (except as listed in HPI above).  Objective:   Physical Exam  BP 116/78   Pulse 64   Ht 5' (1.524 m)   Wt 162 lb (73.5 kg)   SpO2 98%   BMI 31.64 kg/m   Wt Readings from Last 3 Encounters:  12/31/22 163 lb (73.9 kg)  09/17/22 164 lb (74.4 kg)  09/03/22 163 lb (73.9 kg)    General: Appears her stated age, obese, in NAD. Skin: Warm, dry and intact.  Cardiovascular: Normal rate and rhythm. S1,S2 noted.  No murmur, rubs or gallops noted.  Pulmonary/Chest: Normal effort and positive vesicular breath sounds. No respiratory distress. No wheezes, rales or ronchi noted.  Musculoskeletal: Normal flexion, extension, rotation and lateral bending of the spine.  No bony tenderness noted over the lumbar spine or SI joints.  Normal abduction, adduction, internal and external rotation of left hip.  No pain with palpation of the left hip.  Pain with palpation along the left IT band.  Strength 5/5 BLE.  Able to stand on tiptoes and heels.  No difficulty with gait.  Neurological: Alert and oriented. Coordination normal.    BMET    Component Value Date/Time   NA 139 12/31/2022 0839   NA 138 02/03/2013 0911   K 4.6 12/31/2022 0839   K 3.8 02/03/2013 0911   CL 104 12/31/2022 0839   CL 104 02/03/2013 0911   CO2 28 12/31/2022 0839   CO2 30 02/03/2013 0911   GLUCOSE 105 (H) 12/31/2022 0839   GLUCOSE 90 02/03/2013 0911   BUN 19 12/31/2022 0839   BUN 13 02/03/2013 0911   CREATININE 0.89 12/31/2022 0839   CALCIUM 9.8 12/31/2022 0839   CALCIUM 9.1 02/03/2013 0911   GFRNONAA >60 02/03/2013 0911   GFRAA >60 02/03/2013 0911    Lipid Panel     Component Value Date/Time   CHOL 188 12/31/2022 0839   CHOL 244 (H) 02/03/2013 0911   TRIG  202 (H) 12/31/2022 0839   TRIG 199 02/03/2013 0911   HDL 61 12/31/2022 0839   HDL 56 02/03/2013 0911   CHOLHDL 3.1 12/31/2022 0839   VLDL 40 02/03/2013 0911   LDLCALC 98 12/31/2022 0839   LDLCALC 148 (H) 02/03/2013 0911    CBC    Component Value Date/Time   WBC 6.1 12/31/2022 0839   RBC 4.23 12/31/2022 0839   HGB 12.6 12/31/2022 0839   HGB 13.4 02/03/2013 0911   HCT 38.9 12/31/2022 0839   HCT 39.2 02/03/2013 0911   PLT 255 12/31/2022 0839  PLT 279 02/03/2013 0911   MCV 92.0 12/31/2022 0839   MCV 88 02/03/2013 0911   MCH 29.8 12/31/2022 0839   MCHC 32.4 12/31/2022 0839   RDW 12.6 12/31/2022 0839   RDW 12.7 02/03/2013 0911   LYMPHSABS 1.6 02/03/2013 0911   MONOABS 0.5 02/03/2013 0911   EOSABS 0.1 02/03/2013 0911   BASOSABS 0.0 02/03/2013 0911    Hgb A1C Lab Results  Component Value Date   HGBA1C 5.7 (H) 12/31/2022           Assessment & Plan:   Assessment and Plan    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 No signs of radiculopathy or hip joint pathology. Currently on Meloxicam with minimal relief. -Start Prednisone taper over 9 days. -Continue home stretching exercises specific for IT band syndrome. -Consider physical therapy if no improvement with Prednisone and home exercises. -Consider imaging of the back if symptoms persist despite the above interventions.     RTC in 4 months follow up chronic conditions Nicki Reaper, NP

## 2023-02-19 ENCOUNTER — Ambulatory Visit: Payer: BC Managed Care – PPO

## 2023-02-23 ENCOUNTER — Other Ambulatory Visit: Payer: Self-pay

## 2023-02-23 ENCOUNTER — Other Ambulatory Visit: Payer: Self-pay | Admitting: Internal Medicine

## 2023-02-23 MED ORDER — MELOXICAM 15 MG PO TABS
15.0000 mg | ORAL_TABLET | Freq: Every day | ORAL | 1 refills | Status: DC
Start: 2023-02-23 — End: 2024-01-25
  Filled 2023-02-23 – 2023-03-17 (×2): qty 30, 30d supply, fill #0

## 2023-02-26 ENCOUNTER — Other Ambulatory Visit: Payer: Self-pay | Admitting: Internal Medicine

## 2023-02-27 NOTE — Telephone Encounter (Signed)
Requested Prescriptions  Pending Prescriptions Disp Refills   busPIRone (BUSPAR) 5 MG tablet [Pharmacy Med Name: BUSPIRONE HCL 5 MG TABLET] 90 tablet 3    Sig: TAKE 1 TABLET BY MOUTH DAILY AS NEEDED.     Psychiatry: Anxiolytics/Hypnotics - Non-controlled Passed - 02/26/2023  1:32 AM      Passed - Valid encounter within last 12 months    Recent Outpatient Visits           1 week ago Iliotibial band syndrome of left side   [place] [facility] [place], Salvadore Oxford, NP   1 month ago Encounter for general adult medical examination with abnormal findings   [place] [facility] [place], Salvadore Oxford, NP   5 months ago Primary hypertension   [place] [facility], Salvadore Oxford, NP   5 months ago Primary hypertension   [place] [facility], Salvadore Oxford, NP   8 months ago Mixed hyperlipidemia   [place] [facility], Salvadore Oxford, NP       Future Appointments             In 4 months [place], Salvadore Oxford, NP [place] [facility]

## 2023-03-03 ENCOUNTER — Encounter: Payer: Self-pay | Admitting: Internal Medicine

## 2023-03-03 DIAGNOSIS — M7632 Iliotibial band syndrome, left leg: Secondary | ICD-10-CM

## 2023-03-05 ENCOUNTER — Ambulatory Visit
Admission: RE | Admit: 2023-03-05 | Discharge: 2023-03-05 | Disposition: A | Discharge status: Home / Self Care | Payer: BC Managed Care – PPO | Source: Ambulatory Visit | Attending: Internal Medicine | Admitting: Internal Medicine

## 2023-03-05 DIAGNOSIS — Z1231 Encounter for screening mammogram for malignant neoplasm of breast: Secondary | ICD-10-CM | POA: Insufficient documentation

## 2023-03-09 ENCOUNTER — Other Ambulatory Visit: Payer: Self-pay

## 2023-03-09 ENCOUNTER — Ambulatory Visit (INDEPENDENT_AMBULATORY_CARE_PROVIDER_SITE_OTHER): Payer: BC Managed Care – PPO

## 2023-03-09 DIAGNOSIS — Z23 Encounter for immunization: Secondary | ICD-10-CM

## 2023-03-12 DIAGNOSIS — M5451 Vertebrogenic low back pain: Secondary | ICD-10-CM | POA: Diagnosis not present

## 2023-03-12 DIAGNOSIS — M9901 Segmental and somatic dysfunction of cervical region: Secondary | ICD-10-CM | POA: Diagnosis not present

## 2023-03-12 DIAGNOSIS — M9903 Segmental and somatic dysfunction of lumbar region: Secondary | ICD-10-CM | POA: Diagnosis not present

## 2023-03-12 DIAGNOSIS — M9904 Segmental and somatic dysfunction of sacral region: Secondary | ICD-10-CM | POA: Diagnosis not present

## 2023-03-17 DIAGNOSIS — M5451 Vertebrogenic low back pain: Secondary | ICD-10-CM | POA: Diagnosis not present

## 2023-03-17 DIAGNOSIS — M9901 Segmental and somatic dysfunction of cervical region: Secondary | ICD-10-CM | POA: Diagnosis not present

## 2023-03-17 DIAGNOSIS — M9904 Segmental and somatic dysfunction of sacral region: Secondary | ICD-10-CM | POA: Diagnosis not present

## 2023-03-17 DIAGNOSIS — M7632 Iliotibial band syndrome, left leg: Secondary | ICD-10-CM | POA: Diagnosis not present

## 2023-03-17 DIAGNOSIS — M9903 Segmental and somatic dysfunction of lumbar region: Secondary | ICD-10-CM | POA: Diagnosis not present

## 2023-03-17 DIAGNOSIS — M5432 Sciatica, left side: Secondary | ICD-10-CM | POA: Diagnosis not present

## 2023-03-18 ENCOUNTER — Other Ambulatory Visit: Payer: Self-pay

## 2023-03-19 DIAGNOSIS — M7632 Iliotibial band syndrome, left leg: Secondary | ICD-10-CM | POA: Diagnosis not present

## 2023-03-19 DIAGNOSIS — M5432 Sciatica, left side: Secondary | ICD-10-CM | POA: Diagnosis not present

## 2023-03-21 ENCOUNTER — Other Ambulatory Visit: Payer: Self-pay | Admitting: Internal Medicine

## 2023-03-23 ENCOUNTER — Ambulatory Visit
Admission: EM | Admit: 2023-03-23 | Discharge: 2023-03-23 | Disposition: A | Discharge status: Home / Self Care | Payer: BC Managed Care – PPO | Attending: Emergency Medicine | Admitting: Emergency Medicine

## 2023-03-23 ENCOUNTER — Encounter: Payer: Self-pay | Admitting: Emergency Medicine

## 2023-03-23 DIAGNOSIS — R42 Dizziness and giddiness: Secondary | ICD-10-CM

## 2023-03-23 HISTORY — DX: Hyperlipidemia, unspecified: E78.5

## 2023-03-23 MED ORDER — MECLIZINE HCL 12.5 MG PO TABS: 12.5000 mg | ORAL_TABLET | Freq: Three times a day (TID) | ORAL | 0 refills | Status: DC | PRN

## 2023-03-23 NOTE — ED Provider Notes (Signed)
[facility]    CSN: [id number] Arrival date & time: 03/23/23  1617      History   Chief Complaint Chief Complaint  Patient presents with   Dizziness    HPI ORTHA MERENESS is a 57 y.o. female.   Patient presents for evaluation of dizziness beginning 3 days ago.  Occurring constantly, described as feeling off balance, occurring independent of movement.  Has been having intermittent headaches.  Experiencing some mild bilateral ear fullness but denies pain or drainage.  Attempted use of a decongestant which was ineffective.  Denies light or noise sensitivity, vision changes, lightheadedness, syncope, memory or speech changes or weakness.  Believes that she had an episode of dizziness a few years ago but at that time felt different.  Had episode of dizziness earlier this year which her doctor believed to be related to her medication.     Past Medical History:  Diagnosis Date   Hyperlipidemia    Plantar fasciitis     Patient Active Problem List   Diagnosis Date Noted   Prediabetes 09/03/2022   Primary hypertension 09/03/2022   Primary osteoarthritis of both knees 06/30/2022   Anxiety as acute reaction to gross stress 06/30/2022   Class 1 obesity due to excess calories with body mass index (BMI) of 31.0 to 31.9 in adult 12/23/2021   Mixed hyperlipidemia 06/17/2021   Eczema 06/17/2021   OSA on CPAP 07/09/2020    Past Surgical History:  Procedure Laterality Date   TUBAL LIGATION      OB History   No obstetric history on file.      Home Medications    Prior to Admission medications   Medication Sig Start Date End Date Taking? Authorizing Provider  atorvastatin (LIPITOR) 10 MG tablet TAKE 1 TABLET BY MOUTH EVERY DAY 03/23/23   Lorre Munroe, NP  busPIRone (BUSPAR) 5 MG tablet TAKE 1 TABLET BY MOUTH DAILY AS NEEDED. 02/27/23   Lorre Munroe, NP  fluticasone (FLONASE) 50 MCG/ACT nasal spray Place 1 spray into both nostrils daily. 10/28/22   Lorre Munroe, NP  lisinopril (ZESTRIL) 10 MG tablet TAKE 1 TABLET BY MOUTH EVERY DAY 11/28/22   Lorre Munroe, NP  meloxicam (MOBIC) 15 MG tablet Take 1 tablet (15 mg total) by mouth daily. 02/23/23   Lorre Munroe, NP  mupirocin ointment (BACTROBAN) 2 % Apply 1 Application topically 2 (two) times daily. 06/30/22   Lorre Munroe, NP  predniSONE (DELTASONE) 10 MG tablet Take 3 tabs on days 1-3, 2 tabs on days 4-6, 1 tab on days 7-9 02/18/23   Lorre Munroe, NP  Probiotic Product (PROBIOTIC BLEND PO) Take by mouth.    [provider]  triamcinolone cream (KENALOG) 0.1 % APPLY TO AFFECTED AREA TWICE A DAY 10/29/22   Baity, Salvadore Oxford, NP    Family History Family History  Problem Relation Age of Onset   Fibromyalgia Mother    Heart attack Mother        25s   Hypertension Mother    Congestive Heart Failure Mother    Other Father        unknown medical history   Bipolar disorder Sister     Social History Social History   Tobacco Use   Smoking status: Former    Types: Cigarettes   Smokeless tobacco: Never  Vaping Use   Vaping status: Never Used  Substance Use Topics   Alcohol use: Yes    Comment: occassional  Drug use: Never     Allergies   Asa [aspirin]   Review of Systems Review of Systems   Physical Exam Triage Vital Signs ED Triage Vitals  Encounter Vitals Group     BP 03/23/23 1711 117/75     Systolic BP Percentile --      Diastolic BP Percentile --      Pulse Rate 03/23/23 1711 85     Resp 03/23/23 1711 16     Temp 03/23/23 1711 98.3 F (36.8 C)     Temp Source 03/23/23 1711 Oral     SpO2 03/23/23 1711 100 %     Weight --      Height --      Head Circumference --      Peak Flow --      Pain Score 03/23/23 1709 0     Pain Loc --      Pain Education --      Exclude from Growth Chart --    No data found.  Updated Vital Signs BP 117/75 (BP Location: Left Arm)   Pulse 85   Temp 98.3 F (36.8 C) (Oral)   Resp 16   SpO2 100%   Visual  Acuity Right Eye Distance:   Left Eye Distance:   Bilateral Distance:    Right Eye Near:   Left Eye Near:    Bilateral Near:     Physical Exam Constitutional:      Appearance: Normal appearance.  HENT:     Right Ear: Tympanic membrane, ear canal and external ear normal.     Left Ear: Tympanic membrane, ear canal and external ear normal.  Eyes:     Extraocular Movements: Extraocular movements intact.     Conjunctiva/sclera: Conjunctivae normal.     Pupils: Pupils are equal, round, and reactive to light.  Pulmonary:     Effort: Pulmonary effort is normal.  Neurological:     General: No focal deficit present.     Mental Status: She is alert and oriented to person, place, and time. Mental status is at baseline.     Cranial Nerves: No cranial nerve deficit.     Sensory: No sensory deficit.     Motor: No weakness.     Coordination: Coordination normal.     Gait: Gait normal.      UC Treatments / Results  Labs (all labs ordered are listed, but only abnormal results are displayed) Labs Reviewed - No data to display  EKG   Radiology No results found.  Procedures Procedures (including critical care time)  Medications Ordered in UC Medications - No data to display  Initial Impression / Assessment and Plan / UC Course  I have reviewed the triage vital signs and the nursing notes.  Pertinent labs & imaging results that were available during my care of the patient were reviewed by me and considered in my medical decision making (see chart for details).  Dizziness  Vital signs are stable, patient is in no signs of distress nontoxic-appearing, no neurological deficits and no abnormalities to the ear, unknown etiology, EKG shows normal sinus rhythm, has follow-up appointment tomorrow scheduled with PCP advised to keep, prescribed meclizine and discussed safety and supportive measures, for any worsening or new symptoms she is to go to the nearest emergency department for  evaluation Final Clinical Impressions(s) / UC Diagnoses   Final diagnoses:  None   Discharge Instructions   None    ED Prescriptions   None  PDMP not reviewed this encounter.   Valinda Hoar, NP 03/24/23 [phone]

## 2023-03-23 NOTE — ED Triage Notes (Signed)
Pt presents with dizziness x 3 days. Pt states she feels off balance. She does report that her ears feel full.

## 2023-03-23 NOTE — Telephone Encounter (Signed)
Requested Prescriptions  Pending Prescriptions Disp Refills   atorvastatin (LIPITOR) 10 MG tablet [Pharmacy Med Name: ATORVASTATIN 10 MG TABLET] 90 tablet 0    Sig: TAKE 1 TABLET BY MOUTH EVERY DAY     Cardiovascular:  Antilipid - Statins Failed - 03/21/2023  9:09 AM      Failed - Lipid Panel in normal range within the last 12 months    Cholesterol  Date Value Ref Range Status  12/31/2022 188 <200 mg/dL Final  20/25/4270 623 (H) 0 - 200 mg/dL Final   Ldl Cholesterol, Calc  Date Value Ref Range Status  02/03/2013 148 (H) 0 - 100 mg/dL Final   LDL Cholesterol (Calc)  Date Value Ref Range Status  12/31/2022 98 mg/dL (calc) Final    Comment:    Reference range: <100 . Desirable range <100 mg/dL for primary prevention;   <70 mg/dL for patients with CHD or diabetic patients  with > or = 2 CHD risk factors. Marland Kitchen LDL-C is now calculated using the Martin-Hopkins  calculation, which is a validated novel method providing  better accuracy than the Friedewald equation in the  estimation of LDL-C.  Horald Pollen et al. Lenox Ahr. 7628;315(17): 2061-2068  (http://education.[employer].com/faq/FAQ164)    HDL Cholesterol  Date Value Ref Range Status  02/03/2013 56 40 - 60 mg/dL Final   HDL  Date Value Ref Range Status  12/31/2022 61 > OR = 50 mg/dL Final   Triglycerides  Date Value Ref Range Status  12/31/2022 202 (H) <150 mg/dL Final    Comment:    . If a non-fasting specimen was collected, consider repeat triglyceride testing on a fasting specimen if clinically indicated.  Perry Mount et al. J. of Clin. Lipidol. 2015;9:129-169. .   02/03/2013 199 0 - 200 mg/dL Final         Passed - Patient is not pregnant      Passed - Valid encounter within last 12 months    Recent Outpatient Visits           1 month ago Iliotibial band syndrome of left side   [place] [facility] [place], Salvadore Oxford, NP   2 months ago Encounter for general adult medical examination with  abnormal findings   [facility] [place], Salvadore Oxford, NP   6 months ago Primary hypertension   [place] [place], Salvadore Oxford, NP   6 months ago Primary hypertension   [place] [place], Salvadore Oxford, NP   8 months ago Mixed hyperlipidemia   [facility] [place], Salvadore Oxford, NP       Future Appointments             Tomorrow [place] Si, Salvadore Oxford, NP [facility]   In 3 months [place], Salvadore Oxford, NP [facility], [facility]

## 2023-03-23 NOTE — Discharge Instructions (Addendum)
Today you have been evaluated for dizziness  Blood pressure and heart rate are within normal ranges  EKG shows heart is beating in a regular pace and rhythm  No abnormalities to the ear and no neurological abnormalities on exam  Keep upcoming appointment with PCP for further evaluation  You may use 1 to 2 tablets of meclizine every 8 hours as needed to help with dizziness   change positions slowly, waiting at least 10 seconds between movements to help the body stabilize   Ensure adequate intake of water as dehydration will make your symptoms worse  May Attempt to complete the following exercises below to see if they will help minimize your dizziness  -Shake: Turn your head from right to left and back again 10 times in 10 seconds. Twist your head round as far as it will go comfortably when you do this, and look in the direction your head is pointing. Wait 10 seconds after you have done 10 complete turns, then do 10 more.  -Nod: Nod your head up and down and back again 10 times in 10 seconds. Tip your head as far as it will go comfortably when you do this, and look in the direction your head is pointing. Wait 10 seconds after you have done 10 complete turns, then do 10 more.  -Shake, eyes closed (EC): Carry out the shake exercise with your eyes closed. Wait 10 seconds after you have done 10 complete turns, then do 10 more.  -Nod, eyes closed (EC): Carry out the nod exercise with your eyes closed. Wait 10 seconds after you have done 10 complete turns, then do 10 more.  -Shake/stare: Hold your finger pointing upwards in front of you and carry out the shake exercise while staring at your finger. Do not let your eyes move from your finger. Wait 10 seconds after you have done 10 complete turns, then do 10 more.  -Nod/stare: Hold your finger pointing sideways in front of you and carry out the nod exercise while staring at your finger. Do not let your eyes move from your finger. Wait 10 seconds  after you have done 10 complete turns, then do 10 more.  At any point if your symptoms worsen in severity or you begin to have a worsening headache, vision changes, chest pain, memory or speech changes or weakness in your arms or legs please go to the nearest emergency department for immediate evaluation

## 2023-03-24 ENCOUNTER — Encounter: Payer: Self-pay | Admitting: Internal Medicine

## 2023-03-24 ENCOUNTER — Ambulatory Visit: Payer: BC Managed Care – PPO | Admitting: Internal Medicine

## 2023-03-24 VITALS — BP 114/74 | HR 67 | Temp 98.1°F | Ht 60.0 in | Wt 159.8 lb

## 2023-03-24 DIAGNOSIS — H8113 Benign paroxysmal vertigo, bilateral: Secondary | ICD-10-CM

## 2023-03-24 DIAGNOSIS — G4733 Obstructive sleep apnea (adult) (pediatric): Secondary | ICD-10-CM | POA: Diagnosis not present

## 2023-03-24 NOTE — Patient Instructions (Signed)
How to Perform the Epley Maneuver The Epley maneuver is an exercise that relieves symptoms of vertigo. Vertigo is the feeling that you or your surroundings are moving when they are not. When you feel vertigo, you may feel like the room is spinning and may have trouble walking. The Epley maneuver is used for a type of vertigo caused by a calcium deposit in a part of the inner ear. The maneuver involves changing head positions to help the deposit move out of the area. You can do this maneuver at home whenever you have symptoms of vertigo. You can repeat it in 24 hours if your vertigo has not gone away. Even though the Epley maneuver may relieve your vertigo for a few weeks, it is possible that your symptoms will return. This maneuver relieves vertigo, but it does not relieve dizziness. What are the risks? If it is done correctly, the Epley maneuver is considered safe. Sometimes it can lead to dizziness or nausea that goes away after a short time. If you develop other symptoms--such as changes in vision, weakness, or numbness--stop doing the maneuver and call your health care provider. Supplies needed: A bed or table. A pillow. How to do the Epley maneuver     Sit on the edge of a bed or table with your back straight and your legs extended or hanging over the edge of the bed or table. Turn your head halfway toward the affected ear or side as told by your health care provider. Lie backward quickly with your head turned until you are lying flat on your back. Your head should dangle (head-hanging position). You may want to position a pillow under your shoulders. Hold this position for at least 30 seconds. If you feel dizzy or have symptoms of vertigo, continue to hold the position until the symptoms stop. Turn your head to the opposite direction until your unaffected ear is facing down. Your head should continue to dangle. Hold this position for at least 30 seconds. If you feel dizzy or have symptoms of  vertigo, continue to hold the position until the symptoms stop. Turn your whole body to the same side as your head so that you are positioned on your side. Your head will now be nearly facedown and no longer needs to dangle. Hold for at least 30 seconds. If you feel dizzy or have symptoms of vertigo, continue to hold the position until the symptoms stop. Sit back up. You can repeat the maneuver in 24 hours if your vertigo does not go away. Follow these instructions at home: For 24 hours after doing the Epley maneuver: Keep your head in an upright position. When lying down to sleep or rest, keep your head raised (elevated) with two or more pillows. Avoid excessive neck movements. Activity Do not drive or use machinery if you feel dizzy. After doing the Epley maneuver, return to your normal activities as told by your health care provider. Ask your health care provider what activities are safe for you. General instructions Drink enough fluid to keep your urine pale yellow. Do not drink alcohol. Take over-the-counter and prescription medicines only as told by your health care provider. Keep all follow-up visits. This is important. Preventing vertigo symptoms Ask your health care provider if there is anything you should do at home to prevent vertigo. He or she may recommend that you: Keep your head elevated with two or more pillows while you sleep. Do not sleep on the side of your affected ear. Get   up slowly from bed. Avoid sudden movements during the day. Avoid extreme head positions or movement, such as looking up or bending over. Contact a health care provider if: Your vertigo gets worse. You have other symptoms, including: Nausea. Vomiting. Headache. Get help right away if you: Have vision changes. Have a headache or neck pain that is severe or getting worse. Cannot stop vomiting. Have new numbness or weakness in any part of your body. These symptoms may represent a serious problem  that is an emergency. Do not wait to see if the symptoms will go away. Get medical help right away. Call your local emergency services (911 in the [place] Do not drive yourself to the hospital. Summary Vertigo is the feeling that you or your surroundings are moving when they are not. The Epley maneuver is an exercise that relieves symptoms of vertigo. If the Epley maneuver is done correctly, it is considered safe. This information is not intended to replace advice given to you by your health care provider. Make sure you discuss any questions you have with your health care provider. Document Revised: 03/21/2020 Document Reviewed: 03/21/2020 Elsevier Patient Education  2024 [employer]  

## 2023-03-24 NOTE — Progress Notes (Signed)
Subjective:    Patient ID: Diana Rose, female    DOB: Jun 05, 1965, 57 y.o.   MRN: [id number]  HPI  Discussed the use of AI scribe software for clinical note transcription with the patient, who gave verbal consent to proceed.   The patient presents with a four-day history of lightheadedness, which is worse with head movements. The dizziness, described as a sensation of lightheadedness rather than room spinning, began upon waking and has persisted since. The patient denies any associated symptoms such as syncope, vision changes, sensitivity to light or sound, chest pain, or shortness of breath. However, she reports a sensation of heaviness in the chest, which prompted an urgent care visit where an EKG was performed and found to be normal. The patient was prescribed meclizine at the urgent care visit, which she has taken once without significant improvement in symptoms.  The patient also reports mild to moderate headaches but denies any sinus symptoms, runny nose, nasal congestion, or ear pain. She describes a sensation of ear fullness, but an examination at the urgent care found the ears to be clear. The patient has been using a CPAP machine nightly and reports adequate sleep, although she acknowledges some current stress related to living arrangements. The patient has been undergoing physical therapy for IT band syndrome, but denies any head movements during these sessions.       Review of Systems   Past Medical History:  Diagnosis Date   Hyperlipidemia    Plantar fasciitis     Current Outpatient Medications  Medication Sig Dispense Refill   atorvastatin (LIPITOR) 10 MG tablet TAKE 1 TABLET BY MOUTH EVERY DAY 90 tablet 0   busPIRone (BUSPAR) 5 MG tablet TAKE 1 TABLET BY MOUTH DAILY AS NEEDED. 90 tablet 3   fluticasone (FLONASE) 50 MCG/ACT nasal spray Place 1 spray into both nostrils daily. 16 g 0   lisinopril (ZESTRIL) 10 MG tablet TAKE 1 TABLET BY MOUTH EVERY DAY 90 tablet 0    meclizine (ANTIVERT) 12.5 MG tablet Take 1 tablet (12.5 mg total) by mouth 3 (three) times daily as needed for dizziness. 30 tablet 0   meloxicam (MOBIC) 15 MG tablet Take 1 tablet (15 mg total) by mouth daily. 90 tablet 1   mupirocin ointment (BACTROBAN) 2 % Apply 1 Application topically 2 (two) times daily. 22 g 0   predniSONE (DELTASONE) 10 MG tablet Take 3 tabs on days 1-3, 2 tabs on days 4-6, 1 tab on days 7-9 18 tablet 0   Probiotic Product (PROBIOTIC BLEND PO) Take by mouth.     triamcinolone cream (KENALOG) 0.1 % APPLY TO AFFECTED AREA TWICE A DAY 30 g 0   No current facility-administered medications for this visit.    Allergies  Allergen Reactions   Asa [Aspirin] Other (See Comments)    "burning in stomach"     Family History  Problem Relation Age of Onset   Fibromyalgia Mother    Heart attack Mother        65s   Hypertension Mother    Congestive Heart Failure Mother    Other Father        unknown medical history   Bipolar disorder Sister     Social History   Socioeconomic History   Marital status: Legally Separated    Spouse name: Not on file   Number of children: Not on file   Years of education: Not on file   Highest education level: 12th grade  Occupational History  Not on file  Tobacco Use   Smoking status: Former    Types: Cigarettes   Smokeless tobacco: Never  Vaping Use   Vaping status: Never Used  Substance and Sexual Activity   Alcohol use: Yes    Comment: occassional   Drug use: Never   Sexual activity: Not on file  Other Topics Concern   Not on file  Social History Narrative   Not on file   Social Determinants of Health   Financial Resource Strain: Low Risk  (02/18/2023)   Overall Financial Resource Strain (CARDIA)    Difficulty of Paying Living Expenses: Not hard at all  Food Insecurity: Unknown (02/18/2023)   Hunger Vital Sign    Worried About Running Out of Food in the Last Year: Never true    Ran Out of Food in the Last Year: Not  on file  Transportation Needs: No Transportation Needs (02/18/2023)   PRAPARE - Administrator, Civil Service (Medical): No    Lack of Transportation (Non-Medical): No  Physical Activity: Not on file  Stress: No Stress Concern Present (02/18/2023)   [employer] of Occupational Health - Occupational Stress Questionnaire    Feeling of Stress : Only a little  Social Connections: Socially Isolated (02/18/2023)   Social Connection and Isolation Panel [NHANES]    Frequency of Communication with Friends and Family: More than three times a week    Frequency of Social Gatherings with Friends and Family: Once a week    Attends Religious Services: Never    Database administrator or Organizations: No    Attends Engineer, structural: Not on file    Marital Status: Divorced  Intimate Partner Violence: Not on file     Constitutional: Pt reports headache. Denies fever, malaise, fatigue, or abrupt weight changes.  HEENT: Denies eye pain, eye redness, ear pain, ringing in the ears, wax buildup, runny nose, nasal congestion, bloody nose, or sore throat. Respiratory: Denies difficulty breathing, shortness of breath, cough or sputum production.   Cardiovascular: Denies chest pain, chest tightness, palpitations or swelling in the hands or feet.  Gastrointestinal: Denies abdominal pain, bloating, constipation, diarrhea or blood in the stool.  GU: Denies urgency, frequency, pain with urination, burning sensation, blood in urine, odor or discharge. Musculoskeletal: Patient reports joint pain.  Denies decrease in range of motion, difficulty with gait, muscle pain or joint swelling.  Skin: Denies redness, rashes, lesions or ulcercations.  Neurological: Patient reports lightheadeness.  Denies difficulty with memory, difficulty with speech or problems with balance and coordination.  Psych: Patient has a history of anxiety.  Denies depression, SI/HI.  No other specific complaints in a  complete review of systems (except as listed in HPI above).      Objective:   Physical Exam  BP 114/74   Pulse 67   Temp 98.1 F (36.7 C)   Ht 5' (1.524 m)   Wt 159 lb 12.8 oz (72.5 kg)   SpO2 97%   BMI 31.21 kg/m   Wt Readings from Last 3 Encounters:  02/18/23 162 lb (73.5 kg)  12/31/22 163 lb (73.9 kg)  09/17/22 164 lb (74.4 kg)    General: Appears her stated age, obese, in NAD. Skin: Warm, dry and intact.  HEENT: Head: normal shape and size; Eyes: sclera white, no icterus, conjunctiva pink, PERRLA and EOMs intact; Ears: Tm's gray and intact, normal light reflex;  Neck:  No adenopathy noted. Cardiovascular: Normal rate and rhythm. S1,S2 noted.  No  murmur, rubs or gallops noted.  Pulmonary/Chest: Normal effort and positive vesicular breath sounds. No respiratory distress. No wheezes, rales or ronchi noted.  Musculoskeletal: No difficulty with gait.  Neurological: Alert and oriented. Cranial nerves II-XII grossly intact.  Negative Romberg.  Coordination normal.  Psychiatric: Mood and affect normal. Behavior is normal. Judgment and thought content normal.    BMET    Component Value Date/Time   NA 139 12/31/2022 0839   NA 138 02/03/2013 0911   K 4.6 12/31/2022 0839   K 3.8 02/03/2013 0911   CL 104 12/31/2022 0839   CL 104 02/03/2013 0911   CO2 28 12/31/2022 0839   CO2 30 02/03/2013 0911   GLUCOSE 105 (H) 12/31/2022 0839   GLUCOSE 90 02/03/2013 0911   BUN 19 12/31/2022 0839   BUN 13 02/03/2013 0911   CREATININE 0.89 12/31/2022 0839   CALCIUM 9.8 12/31/2022 0839   CALCIUM 9.1 02/03/2013 0911   GFRNONAA >60 02/03/2013 0911   GFRAA >60 02/03/2013 0911    Lipid Panel     Component Value Date/Time   CHOL 188 12/31/2022 0839   CHOL 244 (H) 02/03/2013 0911   TRIG 202 (H) 12/31/2022 0839   TRIG 199 02/03/2013 0911   HDL 61 12/31/2022 0839   HDL 56 02/03/2013 0911   CHOLHDL 3.1 12/31/2022 0839   VLDL 40 02/03/2013 0911   LDLCALC 98 12/31/2022 0839   LDLCALC 148  (H) 02/03/2013 0911    CBC    Component Value Date/Time   WBC 6.1 12/31/2022 0839   RBC 4.23 12/31/2022 0839   HGB 12.6 12/31/2022 0839   HGB 13.4 02/03/2013 0911   HCT 38.9 12/31/2022 0839   HCT 39.2 02/03/2013 0911   PLT 255 12/31/2022 0839   PLT 279 02/03/2013 0911   MCV 92.0 12/31/2022 0839   MCV 88 02/03/2013 0911   MCH 29.8 12/31/2022 0839   MCHC 32.4 12/31/2022 0839   RDW 12.6 12/31/2022 0839   RDW 12.7 02/03/2013 0911   LYMPHSABS 1.6 02/03/2013 0911   MONOABS 0.5 02/03/2013 0911   EOSABS 0.1 02/03/2013 0911   BASOSABS 0.0 02/03/2013 0911    Hgb A1C Lab Results  Component Value Date   HGBA1C 5.7 (H) 12/31/2022            Assessment & Plan:   Assessment and Plan    Urgent care follow-up for Benign Paroxysmal Positional Vertigo (BPPV) Urgent care notes reviewed.  Lightheadedness for four days, worse with head movements. No associated chest pain, shortness of breath, or vision changes. Mild headache reported. Physical exam consistent with BPPV. -Continue Meclizine as needed, every 8 hours. -Perform Epley maneuver at home to reposition otoliths in the inner ear. -Consume adequate amounts of water as dehydration can precipitate dizziness -Follow-up if symptoms persist or worsen.     RTC in 4 months for follow-up of chronic conditions Nicki Reaper, NP

## 2023-03-25 DIAGNOSIS — M5451 Vertebrogenic low back pain: Secondary | ICD-10-CM | POA: Diagnosis not present

## 2023-03-25 DIAGNOSIS — M9903 Segmental and somatic dysfunction of lumbar region: Secondary | ICD-10-CM | POA: Diagnosis not present

## 2023-03-25 DIAGNOSIS — M9904 Segmental and somatic dysfunction of sacral region: Secondary | ICD-10-CM | POA: Diagnosis not present

## 2023-03-25 DIAGNOSIS — M9901 Segmental and somatic dysfunction of cervical region: Secondary | ICD-10-CM | POA: Diagnosis not present

## 2023-03-26 DIAGNOSIS — M5432 Sciatica, left side: Secondary | ICD-10-CM | POA: Diagnosis not present

## 2023-03-26 DIAGNOSIS — M7632 Iliotibial band syndrome, left leg: Secondary | ICD-10-CM | POA: Diagnosis not present

## 2023-04-06 DIAGNOSIS — M5432 Sciatica, left side: Secondary | ICD-10-CM | POA: Diagnosis not present

## 2023-04-06 DIAGNOSIS — M7632 Iliotibial band syndrome, left leg: Secondary | ICD-10-CM | POA: Diagnosis not present

## 2023-04-09 DIAGNOSIS — Z83719 Family history of colon polyps, unspecified: Secondary | ICD-10-CM | POA: Diagnosis not present

## 2023-04-10 DIAGNOSIS — M7632 Iliotibial band syndrome, left leg: Secondary | ICD-10-CM | POA: Diagnosis not present

## 2023-04-10 DIAGNOSIS — M5432 Sciatica, left side: Secondary | ICD-10-CM | POA: Diagnosis not present

## 2023-04-15 DIAGNOSIS — M9903 Segmental and somatic dysfunction of lumbar region: Secondary | ICD-10-CM | POA: Diagnosis not present

## 2023-04-15 DIAGNOSIS — M9904 Segmental and somatic dysfunction of sacral region: Secondary | ICD-10-CM | POA: Diagnosis not present

## 2023-04-15 DIAGNOSIS — M9901 Segmental and somatic dysfunction of cervical region: Secondary | ICD-10-CM | POA: Diagnosis not present

## 2023-04-15 DIAGNOSIS — M5451 Vertebrogenic low back pain: Secondary | ICD-10-CM | POA: Diagnosis not present

## 2023-04-22 DIAGNOSIS — M9904 Segmental and somatic dysfunction of sacral region: Secondary | ICD-10-CM | POA: Diagnosis not present

## 2023-04-22 DIAGNOSIS — M9901 Segmental and somatic dysfunction of cervical region: Secondary | ICD-10-CM | POA: Diagnosis not present

## 2023-04-22 DIAGNOSIS — M9903 Segmental and somatic dysfunction of lumbar region: Secondary | ICD-10-CM | POA: Diagnosis not present

## 2023-04-22 DIAGNOSIS — M5451 Vertebrogenic low back pain: Secondary | ICD-10-CM | POA: Diagnosis not present

## 2023-05-07 ENCOUNTER — Ambulatory Visit: Payer: BC Managed Care – PPO

## 2023-05-07 DIAGNOSIS — Z83719 Family history of colon polyps, unspecified: Secondary | ICD-10-CM | POA: Diagnosis not present

## 2023-05-07 DIAGNOSIS — K5730 Diverticulosis of large intestine without perforation or abscess without bleeding: Secondary | ICD-10-CM | POA: Diagnosis not present

## 2023-05-07 DIAGNOSIS — Z1211 Encounter for screening for malignant neoplasm of colon: Secondary | ICD-10-CM | POA: Diagnosis not present

## 2023-05-07 DIAGNOSIS — K635 Polyp of colon: Secondary | ICD-10-CM | POA: Diagnosis not present

## 2023-05-07 DIAGNOSIS — K640 First degree hemorrhoids: Secondary | ICD-10-CM | POA: Diagnosis not present

## 2023-05-16 ENCOUNTER — Other Ambulatory Visit: Payer: Self-pay | Admitting: Internal Medicine

## 2023-05-18 MED ORDER — LISINOPRIL 10 MG PO TABS: 10.0000 mg | ORAL_TABLET | Freq: Every day | ORAL | 0 refills | Status: DC

## 2023-06-02 ENCOUNTER — Encounter: Payer: Self-pay | Admitting: Internal Medicine

## 2023-06-02 ENCOUNTER — Ambulatory Visit: Payer: BC Managed Care – PPO | Admitting: Internal Medicine

## 2023-06-02 VITALS — BP 112/70 | HR 99 | Temp 100.6°F | Ht 60.0 in | Wt 163.6 lb

## 2023-06-02 DIAGNOSIS — J101 Influenza due to other identified influenza virus with other respiratory manifestations: Secondary | ICD-10-CM

## 2023-06-02 DIAGNOSIS — R051 Acute cough: Secondary | ICD-10-CM | POA: Diagnosis not present

## 2023-06-02 LAB — POC COVID19/FLU A&B COMBO
Covid Antigen, POC: NEGATIVE
Influenza A Antigen, POC: POSITIVE — AB
Influenza B Antigen, POC: NEGATIVE

## 2023-06-02 MED ORDER — OSELTAMIVIR PHOSPHATE 75 MG PO CAPS: 75.0000 mg | ORAL_CAPSULE | Freq: Two times a day (BID) | ORAL | 0 refills | Status: DC

## 2023-06-02 MED ORDER — BENZONATATE 200 MG PO CAPS: 200.0000 mg | ORAL_CAPSULE | Freq: Three times a day (TID) | ORAL | 0 refills | Status: DC | PRN

## 2023-06-02 NOTE — Patient Instructions (Signed)

## 2023-06-02 NOTE — Progress Notes (Signed)
Subjective:    Patient ID: Diana Rose, female    DOB: March 24, 1966, 58 y.o.   MRN: [id number]  HPI  Discussed the use of AI scribe software for clinical note transcription with the patient, who gave verbal consent to proceed.  The patient presents with a three-week history of cough, chills, and headache. She lives with her sister-in-law, who has also been sick recently.  The patient has been experiencing a cough for the past three weeks, which has recently worsened within the last 48 hours. The cough is sometimes productive with clear sputum. She also reports chills and a headache described as feeling like her 'head's in a vice.'  She mentions having a runny nose and nasal congestion, both of which are clear. Over-the-counter medications such as Sudafed, DayQuil, and NyQuil have been used to manage these symptoms.  She reports experiencing shortness of breath and a lack of appetite. No ear pain, significant sore throat, nausea, vomiting, or diarrhea.   Review of Systems   Past Medical History:  Diagnosis Date   Hyperlipidemia    Plantar fasciitis     Current Outpatient Medications  Medication Sig Dispense Refill   atorvastatin (LIPITOR) 10 MG tablet TAKE 1 TABLET BY MOUTH EVERY DAY 90 tablet 0   busPIRone (BUSPAR) 5 MG tablet TAKE 1 TABLET BY MOUTH DAILY AS NEEDED. 90 tablet 3   fluticasone (FLONASE) 50 MCG/ACT nasal spray Place 1 spray into both nostrils daily. 16 g 0   lisinopril (ZESTRIL) 10 MG tablet Take 1 tablet (10 mg total) by mouth daily. 90 tablet 0   meclizine (ANTIVERT) 12.5 MG tablet Take 1 tablet (12.5 mg total) by mouth 3 (three) times daily as needed for dizziness. 30 tablet 0   meloxicam (MOBIC) 15 MG tablet Take 1 tablet (15 mg total) by mouth daily. 90 tablet 1   mupirocin ointment (BACTROBAN) 2 % Apply 1 Application topically 2 (two) times daily. 22 g 0   Probiotic Product (PROBIOTIC BLEND PO) Take by mouth.     triamcinolone cream (KENALOG) 0.1 % APPLY TO  AFFECTED AREA TWICE A DAY 30 g 0   No current facility-administered medications for this visit.    Allergies  Allergen Reactions   Asa [Aspirin] Other (See Comments)    "burning in stomach"     Family History  Problem Relation Age of Onset   Fibromyalgia Mother    Heart attack Mother        46s   Hypertension Mother    Congestive Heart Failure Mother    Other Father        unknown medical history   Bipolar disorder Sister     Social History   Socioeconomic History   Marital status: Legally Separated    Spouse name: Not on file   Number of children: Not on file   Years of education: Not on file   Highest education level: 12th grade  Occupational History   Not on file  Tobacco Use   Smoking status: Former    Types: Cigarettes   Smokeless tobacco: Never  Vaping Use   Vaping status: Never Used  Substance and Sexual Activity   Alcohol use: Yes    Comment: occassional   Drug use: Never   Sexual activity: Not on file  Other Topics Concern   Not on file  Social History Narrative   Not on file   Social Drivers of Health   Financial Resource Strain: Low Risk  (04/10/2023)   Received  [facility]   Overall Financial Resource Strain (CARDIA)    Difficulty of Paying Living Expenses: Not very hard  Food Insecurity: No Food Insecurity (04/10/2023)   Received from [facility]   Hunger Vital Sign    Worried About Running Out of Food in the Last Year: Never true    Ran Out of Food in the Last Year: Never true  Transportation Needs: No Transportation Needs (04/10/2023)   [facility] from [facility] - Transportation    In the past 12 months, has lack of transportation kept you from medical appointments or from getting medications?: No    Lack of Transportation (Non-Medical): No  Physical Activity: Not on file  Stress: No Stress Concern Present (02/18/2023)   [employer] of Occupational Health -  Occupational Stress Questionnaire    Feeling of Stress : Only a little  Social Connections: Socially Isolated (02/18/2023)   Social Connection and Isolation Panel [NHANES]    Frequency of Communication with Friends and Family: More than three times a week    Frequency of Social Gatherings with Friends and Family: Once a week    Attends Religious Services: Never    Database administrator or Organizations: No    Attends Engineer, structural: Not on file    Marital Status: Divorced  Intimate Partner Violence: Not on file     Constitutional: Pt reports headache, fatigue, fever, chills. Denies abrupt weight changes.  HEENT: Pt reports runny nose, nasal congestion and sore throat. Denies eye pain, eye redness, ear pain, ringing in the ears, wax buildup, bloody nose. Respiratory: Pt reports cough and shortness of breath. Denies difficulty breathing.   Cardiovascular: Denies chest pain, chest tightness, palpitations or swelling in the hands or feet.  Gastrointestinal: Denies abdominal pain, bloating, constipation, diarrhea or blood in the stool.  GU: Denies urgency, frequency, pain with urination, burning sensation, blood in urine, odor or discharge. Musculoskeletal: Denies decrease in range of motion, difficulty with gait, muscle pain or joint pain and swelling.  Skin: Denies redness, rashes, lesions or ulcercations.  Neurological: Denies dizziness, difficulty with memory, difficulty with speech or problems with balance and coordination.    No other specific complaints in a complete review of systems (except as listed in HPI above).      Objective:   Physical Exam  BP 112/70 (BP Location: Right Arm, Patient Position: Sitting, Cuff Size: Normal)   Pulse 99   Temp (!) 100.6 F (38.1 C)   Ht 5' (1.524 m)   Wt 163 lb 9.6 oz (74.2 kg)   SpO2 95%   BMI 31.95 kg/m   Wt Readings from Last 3 Encounters:  03/24/23 159 lb 12.8 oz (72.5 kg)  02/18/23 162 lb (73.5 kg)  12/31/22 163  lb (73.9 kg)    General: Appears her stated age, obese, in NAD. Skin: Warm, dry and intact. No rashes noted. HEENT: Head: normal shape and size, no sinus tenderness noted; Eyes: sclera white, no icterus, conjunctiva pink, PERRLA and EOMs intact;  Nose: mucosa pink and moist, septum midline; Throat/Mouth: Teeth present, mucosa erythematous and moist, no exudate, lesions or ulcerations noted.  Neck:  No adenopathy noted. Cardiovascular: Normal rate and rhythm. S1,S2 noted.  No murmur, rubs or gallops noted.  Pulmonary/Chest: Normal effort and positive vesicular breath sounds. No respiratory distress. No wheezes, rales or ronchi noted.   Neurological: Alert and oriented.   BMET    Component Value Date/Time  NA 139 12/31/2022 0839   NA 138 02/03/2013 0911   K 4.6 12/31/2022 0839   K 3.8 02/03/2013 0911   CL 104 12/31/2022 0839   CL 104 02/03/2013 0911   CO2 28 12/31/2022 0839   CO2 30 02/03/2013 0911   GLUCOSE 105 (H) 12/31/2022 0839   GLUCOSE 90 02/03/2013 0911   BUN 19 12/31/2022 0839   BUN 13 02/03/2013 0911   CREATININE 0.89 12/31/2022 0839   CALCIUM 9.8 12/31/2022 0839   CALCIUM 9.1 02/03/2013 0911   GFRNONAA >60 02/03/2013 0911   GFRAA >60 02/03/2013 0911    Lipid Panel     Component Value Date/Time   CHOL 188 12/31/2022 0839   CHOL 244 (H) 02/03/2013 0911   TRIG 202 (H) 12/31/2022 0839   TRIG 199 02/03/2013 0911   HDL 61 12/31/2022 0839   HDL 56 02/03/2013 0911   CHOLHDL 3.1 12/31/2022 0839   VLDL 40 02/03/2013 0911   LDLCALC 98 12/31/2022 0839   LDLCALC 148 (H) 02/03/2013 0911    CBC    Component Value Date/Time   WBC 6.1 12/31/2022 0839   RBC 4.23 12/31/2022 0839   HGB 12.6 12/31/2022 0839   HGB 13.4 02/03/2013 0911   HCT 38.9 12/31/2022 0839   HCT 39.2 02/03/2013 0911   PLT 255 12/31/2022 0839   PLT 279 02/03/2013 0911   MCV 92.0 12/31/2022 0839   MCV 88 02/03/2013 0911   MCH 29.8 12/31/2022 0839   MCHC 32.4 12/31/2022 0839   RDW 12.6 12/31/2022  0839   RDW 12.7 02/03/2013 0911   LYMPHSABS 1.6 02/03/2013 0911   MONOABS 0.5 02/03/2013 0911   EOSABS 0.1 02/03/2013 0911   BASOSABS 0.0 02/03/2013 0911    Hgb A1C Lab Results  Component Value Date   HGBA1C 5.7 (H) 12/31/2022            Assessment & Plan:  Assessment and Plan    Influenza Positive flu test with symptoms of cough, headache, nasal congestion, and shortness of breath. No signs of bacterial superinfection. Patient is within the window for antiviral treatment. -Prescribe Tamiflu 75 mg BID x 5 days. -Prescribe Tessalon 200mg  capsules for cough, one every eight hours. -Advise rest, fluids, and alternating ibuprofen and Tylenol as needed for body aches and fever. -Provide work note.   RTC in 1 months for followup of chronic conditions Nicki Reaper, NP

## 2023-06-04 ENCOUNTER — Ambulatory Visit: Payer: BC Managed Care – PPO | Admitting: Internal Medicine

## 2023-06-17 ENCOUNTER — Other Ambulatory Visit: Payer: Self-pay | Admitting: Internal Medicine

## 2023-06-17 NOTE — Telephone Encounter (Signed)
Requested Prescriptions  Pending Prescriptions Disp Refills   atorvastatin (LIPITOR) 10 MG tablet [Pharmacy Med Name: ATORVASTATIN 10 MG TABLET] 90 tablet 0    Sig: TAKE 1 TABLET BY MOUTH EVERY DAY     Cardiovascular:  Antilipid - Statins Failed - 06/17/2023  3:08 PM      Failed - Lipid Panel in normal range within the last 12 months    Cholesterol  Date Value Ref Range Status  12/31/2022 188 <200 mg/dL Final  81/19/1478 295 (H) 0 - 200 mg/dL Final   Ldl Cholesterol, Calc  Date Value Ref Range Status  02/03/2013 148 (H) 0 - 100 mg/dL Final   LDL Cholesterol (Calc)  Date Value Ref Range Status  12/31/2022 98 mg/dL (calc) Final    Comment:    Reference range: <100 . Desirable range <100 mg/dL for primary prevention;   <70 mg/dL for patients with CHD or diabetic patients  with > or = 2 CHD risk factors. Marland Kitchen LDL-C is now calculated using the Martin-Hopkins  calculation, which is a validated novel method providing  better accuracy than the Friedewald equation in the  estimation of LDL-C.  Horald Pollen et al. Lenox Ahr. 6213;086(57): 2061-2068  (http://education.[facility].com/faq/FAQ164)    HDL Cholesterol  Date Value Ref Range Status  02/03/2013 56 40 - 60 mg/dL Final   HDL  Date Value Ref Range Status  12/31/2022 61 > OR = 50 mg/dL Final   Triglycerides  Date Value Ref Range Status  12/31/2022 202 (H) <150 mg/dL Final    Comment:    . If a non-fasting specimen was collected, consider repeat triglyceride testing on a fasting specimen if clinically indicated.  Perry Mount et al. J. of Clin. Lipidol. 2015;9:129-169. .   02/03/2013 199 0 - 200 mg/dL Final         Passed - Patient is not pregnant      Passed - Valid encounter within last 12 months    Recent Outpatient Visits           2 weeks ago Influenza A   [place] [facility] [place], [place] W, NP   2 months ago Benign paroxysmal positional vertigo due to bilateral vestibular disorder   [facility] [place], [place] W, NP   3 months ago Iliotibial band syndrome of left side   [place] [facility] [place], Salv[place] Oxford, NP   5 months ago Encounter for general adult medical examination with abnormal findings   [place] [facility] [place], Salvadore Oxford, NP   9 months ago Primary hypertension   [place] [facility] [place], Salvadore Oxford, NP       Future Appointments             In 1 week Sampson Si, Salvadore Oxford, NP  [facility]

## 2023-06-22 ENCOUNTER — Encounter: Payer: Self-pay | Admitting: Internal Medicine

## 2023-06-29 ENCOUNTER — Ambulatory Visit: Payer: Self-pay | Admitting: Internal Medicine

## 2023-07-06 ENCOUNTER — Ambulatory Visit: Payer: BC Managed Care – PPO | Admitting: Internal Medicine

## 2023-07-17 ENCOUNTER — Ambulatory Visit: Payer: BC Managed Care – PPO | Admitting: Internal Medicine

## 2023-07-17 ENCOUNTER — Ambulatory Visit: Admitting: Internal Medicine

## 2023-07-17 VITALS — BP 118/64 | Ht 60.0 in | Wt 163.2 lb

## 2023-07-17 DIAGNOSIS — Z6831 Body mass index (BMI) 31.0-31.9, adult: Secondary | ICD-10-CM

## 2023-07-17 DIAGNOSIS — G4733 Obstructive sleep apnea (adult) (pediatric): Secondary | ICD-10-CM

## 2023-07-17 DIAGNOSIS — E6609 Other obesity due to excess calories: Secondary | ICD-10-CM

## 2023-07-17 DIAGNOSIS — I10 Essential (primary) hypertension: Secondary | ICD-10-CM | POA: Diagnosis not present

## 2023-07-17 DIAGNOSIS — L308 Other specified dermatitis: Secondary | ICD-10-CM

## 2023-07-17 DIAGNOSIS — E66811 Obesity, class 1: Secondary | ICD-10-CM

## 2023-07-17 DIAGNOSIS — R7303 Prediabetes: Secondary | ICD-10-CM

## 2023-07-17 DIAGNOSIS — E782 Mixed hyperlipidemia: Secondary | ICD-10-CM

## 2023-07-17 DIAGNOSIS — F411 Generalized anxiety disorder: Secondary | ICD-10-CM

## 2023-07-17 DIAGNOSIS — M170 Bilateral primary osteoarthritis of knee: Secondary | ICD-10-CM

## 2023-07-17 DIAGNOSIS — K219 Gastro-esophageal reflux disease without esophagitis: Secondary | ICD-10-CM | POA: Insufficient documentation

## 2023-07-17 DIAGNOSIS — F430 Acute stress reaction: Secondary | ICD-10-CM

## 2023-07-17 MED ORDER — TRIAMCINOLONE ACETONIDE 0.1 % EX CREA: TOPICAL_CREAM | Freq: Two times a day (BID) | CUTANEOUS | 0 refills | Status: DC

## 2023-07-17 MED ORDER — LISINOPRIL 10 MG PO TABS: 10.0000 mg | ORAL_TABLET | Freq: Every day | ORAL | 0 refills | Status: DC

## 2023-07-17 NOTE — Assessment & Plan Note (Signed)
 Encourage diet and exercise for weight loss

## 2023-07-17 NOTE — Assessment & Plan Note (Signed)
Encourage weight loss as this can help sleep apnea Continue CPAP use

## 2023-07-17 NOTE — Assessment & Plan Note (Signed)
Continue triamcinolone cream, refilled today

## 2023-07-17 NOTE — Assessment & Plan Note (Signed)
 Controlled on lisinopril Reinforced DASH diet and exercise for weight loss [facility] today

## 2023-07-17 NOTE — Progress Notes (Signed)
 Subjective:    Patient ID: Diana Rose, female    DOB: [birth year], 58 y.o.   MRN: [id number]  HPI  Patient presents to clinic today for follow-up of chronic conditions.  HLD: Her last LDL was 98, triglycerides 284, 12/2022.  She denies myalgias on atorvastatin.  She tries to consume low-fat diet.  OSA: She averages 7 hours of sleep per night with the use of her CPAP.  There is no sleep study on file.  Eczema: Managed with triamcinolone cream.  She does not follow with dermatology.  OA: Mainly in her knees and right ankle/foot.  She sees a [facility] as needed.  She takes meloxicam as needed would some relief of symptoms.  She does not follow with orthopedics.  HTN: Her BP today is 118/64.  She is taking lisinopril as prescribed.  ECG from 03/2023 reviewed.  Anxiety: Situational.  She is taking buspirone as needed.  She is not currently seeing a therapist.  She denies depression, SI/HI.  Prediabetes: Her last A1c was 5.7%, 12/2022.  She is not taking any oral diabetic medication at this time.  She does not check her sugars.  GERD: Triggered by spicy foods. She takes tums as needed with good relief of symptoms. There is no upper GI on file.  Review of Systems  Past Medical History:  Diagnosis Date   Hyperlipidemia    Plantar fasciitis     Current Outpatient Medications  Medication Sig Dispense Refill   atorvastatin (LIPITOR) 10 MG tablet TAKE 1 TABLET BY MOUTH EVERY DAY 90 tablet 0   benzonatate (TESSALON) 200 MG capsule Take 1 capsule (200 mg total) by mouth 3 (three) times daily as needed. 30 capsule 0   busPIRone (BUSPAR) 5 MG tablet TAKE 1 TABLET BY MOUTH DAILY AS NEEDED. 90 tablet 3   fluticasone (FLONASE) 50 MCG/ACT nasal spray Place 1 spray into both nostrils daily. 16 g 0   lisinopril (ZESTRIL) 10 MG tablet Take 1 tablet (10 mg total) by mouth daily. 90 tablet 0   meclizine (ANTIVERT) 12.5 MG tablet Take 1 tablet (12.5 mg total) by mouth 3 (three) times daily as  needed for dizziness. 30 tablet 0   meloxicam (MOBIC) 15 MG tablet Take 1 tablet (15 mg total) by mouth daily. 90 tablet 1   mupirocin ointment (BACTROBAN) 2 % Apply 1 Application topically 2 (two) times daily. 22 g 0   oseltamivir (TAMIFLU) 75 MG capsule Take 1 capsule (75 mg total) by mouth 2 (two) times daily. For 5 days 10 capsule 0   Probiotic Product (PROBIOTIC BLEND PO) Take by mouth.     triamcinolone cream (KENALOG) 0.1 % APPLY TO AFFECTED AREA TWICE A DAY 30 g 0   No current facility-administered medications for this visit.    Allergies  Allergen Reactions   Asa [Aspirin] Other (See Comments)    "burning in stomach"     Family History  Problem Relation Age of Onset   Fibromyalgia Mother    Heart attack Mother        14s   Hypertension Mother    Congestive Heart Failure Mother    Other Father        unknown medical history   Bipolar disorder Sister     Social History   Socioeconomic History   Marital status: Legally Separated    Spouse name: Not on file   Number of children: Not on file   Years of education: Not on file   Highest education  level: 12th grade  Occupational History   Not on file  Tobacco Use   Smoking status: Former    Types: Cigarettes   Smokeless tobacco: Never  Vaping Use   Vaping status: Never Used  Substance and Sexual Activity   Alcohol use: Yes    Comment: occassional   Drug use: Never   Sexual activity: Not on file  Other Topics Concern   Not on file  Social History Narrative   Not on file   Social Drivers of Health   Financial Resource Strain: Low Risk  (04/10/2023)   Received from [facility]   Overall Financial Resource Strain (CARDIA)    Difficulty of Paying Living Expenses: Not very hard  Food Insecurity: No Food Insecurity (04/10/2023)   Received from [facility]   Hunger Vital Sign    Worried About Running Out of Food in the Last Year: Never true    Ran Out of Food in the Last Year:  Never true  Transportation Needs: No Transportation Needs (04/10/2023)   Received from [facility] - Transportation    In the past 12 months, has lack of transportation kept you from medical appointments or from getting medications?: No    Lack of Transportation (Non-Medical): No  Physical Activity: Not on file  Stress: No Stress Concern Present (02/18/2023)   [employer] of Occupational Health - Occupational Stress Questionnaire    Feeling of Stress : Only a little  Social Connections: Socially Isolated (02/18/2023)   Social Connection and Isolation Panel [NHANES]    Frequency of Communication with Friends and Family: More than three times a week    Frequency of Social Gatherings with Friends and Family: Once a week    Attends Religious Services: Never    Database administrator or Organizations: No    Attends Engineer, structural: Not on file    Marital Status: Divorced  Intimate Partner Violence: Not on file     Constitutional: Denies fever, malaise, fatigue, headache or abrupt weight changes.  HEENT: Denies eye pain, eye redness, ear pain, ringing in the ears, wax buildup, runny nose, nasal congestion, bloody nose, or sore throat. Respiratory: Denies difficulty breathing, shortness of breath, cough or sputum production.   Cardiovascular: Denies chest pain, chest tightness, palpitations or swelling in the hands or feet.  Gastrointestinal: Pt reports intermittent reflux. Denies abdominal pain, bloating, constipation, diarrhea or blood in the stool.  GU: Denies urgency, frequency, pain with urination, burning sensation, blood in urine, odor or discharge. Musculoskeletal: Patient reports intermittent knee pain, chronic right foot and ankle pain.  Denies decrease in range of motion, difficulty with gait, muscle pain or joint swelling.  Skin: Patient reports dry skin.  Denies redness, rashes, lesions or ulcercations.  Neurological: Denies  dizziness, difficulty with memory, difficulty with speech or problems with balance and coordination.  Psych: Pt has a history of anxiety. Denies depression, SI/HI.  No other specific complaints in a complete review of systems (except as listed in HPI above).     Objective:   Physical Exam  BP 118/64 (BP Location: Left Arm, Patient Position: Sitting, Cuff Size: Normal)   Ht 5' (1.524 m)   Wt 163 lb 3.2 oz (74 kg)   BMI 31.87 kg/m    Wt Readings from Last 3 Encounters:  06/02/23 163 lb 9.6 oz (74.2 kg)  03/24/23 159 lb 12.8 oz (72.5 kg)  02/18/23 162 lb (73.5 kg)  General: Appears her stated age, obese, in NAD. Skin: Warm, dry and intact.   HEENT: Head: normal shape and size; Eyes: sclera white, no icterus, conjunctiva pink, PERRLA and EOMs intact;  Cardiovascular: Normal rate and rhythm. S1,S2 noted.  No murmur, rubs or gallops noted. No JVD or BLE edema. No carotid bruits noted. Pulmonary/Chest: Normal effort and positive vesicular breath sounds. No respiratory distress. No wheezes, rales or ronchi noted.  Abdomen: Normal bowel sounds. Musculoskeletal: Strength 5/5 BUE/BLE. No difficulty with gait.  Neurological: Alert and oriented. Coordination normal.  Psychiatric: Mood and affect normal.  Behavior normal. Judgment and thought content normal.    BMET    Component Value Date/Time   NA 139 12/31/2022 0839   NA 138 02/03/2013 0911   K 4.6 12/31/2022 0839   K 3.8 02/03/2013 0911   CL 104 12/31/2022 0839   CL 104 02/03/2013 0911   CO2 28 12/31/2022 0839   CO2 30 02/03/2013 0911   GLUCOSE 105 (H) 12/31/2022 0839   GLUCOSE 90 02/03/2013 0911   BUN 19 12/31/2022 0839   BUN 13 02/03/2013 0911   CREATININE 0.89 12/31/2022 0839   CALCIUM 9.8 12/31/2022 0839   CALCIUM 9.1 02/03/2013 0911   GFRNONAA >60 02/03/2013 0911   GFRAA >60 02/03/2013 0911    Lipid Panel     Component Value Date/Time   CHOL 188 12/31/2022 0839   CHOL 244 (H) 02/03/2013 0911   TRIG 202 (H)  12/31/2022 0839   TRIG 199 02/03/2013 0911   HDL 61 12/31/2022 0839   HDL 56 02/03/2013 0911   CHOLHDL 3.1 12/31/2022 0839   VLDL 40 02/03/2013 0911   LDLCALC 98 12/31/2022 0839   LDLCALC 148 (H) 02/03/2013 0911    CBC    Component Value Date/Time   WBC 6.1 12/31/2022 0839   RBC 4.23 12/31/2022 0839   HGB 12.6 12/31/2022 0839   HGB 13.4 02/03/2013 0911   HCT 38.9 12/31/2022 0839   HCT 39.2 02/03/2013 0911   PLT 255 12/31/2022 0839   PLT 279 02/03/2013 0911   MCV 92.0 12/31/2022 0839   MCV 88 02/03/2013 0911   MCH 29.8 12/31/2022 0839   MCHC 32.4 12/31/2022 0839   RDW 12.6 12/31/2022 0839   RDW 12.7 02/03/2013 0911   LYMPHSABS 1.6 02/03/2013 0911   MONOABS 0.5 02/03/2013 0911   EOSABS 0.1 02/03/2013 0911   BASOSABS 0.0 02/03/2013 0911    Hgb A1C Lab Results  Component Value Date   HGBA1C 5.7 (H) 12/31/2022           Assessment & Plan:     RTC in 6 months for your annual exam Nicki Reaper, NP

## 2023-07-17 NOTE — Progress Notes (Signed)
 [facility] [address] [place], [place] [address]  Pulmonary Sleep Medicine   Office Visit Note  Patient Name: Diana Rose DOB: 09/07/1965 MRN [id number]    Chief Complaint: Obstructive Sleep Apnea visit  Brief History:  Diana Rose is seen today for an annual follow up visit for CPAP@ 9 cmH2O. The patient has a 4.5 year history of sleep apnea. Patient is using PAP nightly.  The patient feels rested after sleeping with PAP.  The patient reports benefiting from PAP use. Reported sleepiness is  improved and the Epworth Sleepiness Score is 4 out of 24. The patient does take naps. The patient complains of the following: none.  The compliance download shows 96% compliance with an average use time of 7 hours 39 minutes. The AHI is 1.6.  The patient does not complain of limb movements disrupting sleep. The patient continues to require PAP therapy in order to eliminate sleep apnea.   ROS  General: (-) fever, (-) chills, (-) night sweat Nose and Sinuses: (-) nasal stuffiness or itchiness, (-) postnasal drip, (-) nosebleeds, (-) sinus trouble. Mouth and Throat: (-) sore throat, (-) hoarseness. Neck: (-) swollen glands, (-) enlarged thyroid, (-) neck pain. Respiratory: - cough, - shortness of breath, - wheezing. Neurologic: - numbness, - tingling. Psychiatric: +anxiety, - depression   Current Medication: Outpatient Encounter Medications as of 07/20/2023  Medication Sig   atorvastatin (LIPITOR) 10 MG tablet TAKE 1 TABLET BY MOUTH EVERY DAY   busPIRone (BUSPAR) 5 MG tablet TAKE 1 TABLET BY MOUTH DAILY AS NEEDED.   fluticasone (FLONASE) 50 MCG/ACT nasal spray Place 1 spray into both nostrils daily.   lisinopril (ZESTRIL) 10 MG tablet Take 1 tablet (10 mg total) by mouth daily.   meloxicam (MOBIC) 15 MG tablet Take 1 tablet (15 mg total) by mouth daily.   mupirocin ointment (BACTROBAN) 2 % Apply 1 Application topically 2 (two) times daily.   Probiotic Product (PROBIOTIC BLEND PO)  Take by mouth.   triamcinolone cream (KENALOG) 0.1 % Apply topically 2 (two) times daily.   No facility-administered encounter medications on file as of 07/20/2023.    Surgical History: Past Surgical History:  Procedure Laterality Date   TUBAL LIGATION      Medical History: Past Medical History:  Diagnosis Date   Hyperlipidemia    Plantar fasciitis     Family History: Non contributory to the present illness  Social History: Social History   Socioeconomic History   Marital status: Legally Separated    Spouse name: Not on file   Number of children: Not on file   Years of education: Not on file   Highest education level: 12th grade  Occupational History   Not on file  Tobacco Use   Smoking status: Former    Types: Cigarettes   Smokeless tobacco: Never  Vaping Use   Vaping status: Never Used  Substance and Sexual Activity   Alcohol use: Yes    Comment: occassional   Drug use: Never   Sexual activity: Not on file  Other Topics Concern   Not on file  Social History Narrative   Not on file   Social Drivers of Health   Financial Resource Strain: Low Risk  (07/17/2023)   Overall Financial Resource Strain (CARDIA)    Difficulty of Paying Living Expenses: Not hard at all  Food Insecurity: No Food Insecurity (07/17/2023)   Hunger Vital Sign    Worried About Running Out of Food in the Last Year: Never true  Ran Out of Food in the Last Year: Never true  Transportation Needs: No Transportation Needs (07/17/2023)   PRAPARE - Administrator, Civil Service (Medical): No    Lack of Transportation (Non-Medical): No  Physical Activity: Insufficiently Active (07/17/2023)   Exercise Vital Sign    Days of Exercise per Week: 3 days    Minutes of Exercise per Session: 30 min  Stress: No Stress Concern Present (07/17/2023)   [employer] of Occupational Health - Occupational Stress Questionnaire    Feeling of Stress : Not at all  Social Connections: Moderately  Isolated (07/17/2023)   Social Connection and Isolation Panel [NHANES]    Frequency of Communication with Friends and Family: Once a week    Frequency of Social Gatherings with Friends and Family: Twice a week    Attends Religious Services: 1 to 4 times per year    Active Member of [employer] or Organizations: No    Attends Engineer, structural: Not on file    Marital Status: Divorced  Intimate Partner Violence: Not on file    Vital Signs: Blood pressure 112/70, pulse 70, resp. rate 16, height 5' (1.524 m), weight 162 lb (73.5 kg), SpO2 97%. Body mass index is 31.64 kg/m.    Examination: General Appearance: The patient is well-developed, well-nourished, and in no distress. Neck Circumference: 44 cm Skin: Gross inspection of skin unremarkable. Head: normocephalic, no gross deformities. Eyes: no gross deformities noted. ENT: ears appear grossly normal Neurologic: Alert and oriented. No involuntary movements.  STOP BANG RISK ASSESSMENT S (snore) Have you been told that you snore?     NO   T (tired) Are you often tired, fatigued, or sleepy during the day?   NO  O (obstruction) Do you stop breathing, choke, or gasp during sleep? NO   P (pressure) Do you have or are you being treated for high blood pressure? YES   B (BMI) Is your body index greater than 35 kg/m? NO   A (age) Are you 37 years old or older? YES   N (neck) Do you have a neck circumference greater than 16 inches?   YES   G (gender) Are you a female? NO   TOTAL STOP/BANG "YES" ANSWERS 3       A STOP-Bang score of 2 or less is considered low risk, and a score of 5 or more is high risk for having either moderate or severe OSA. For people who score 3 or 4, doctors may need to perform further assessment to determine how likely they are to have OSA.         EPWORTH SLEEPINESS SCALE:  Scale:  (0)= no chance of dozing; (1)= slight chance of dozing; (2)= moderate chance of dozing; (3)= high chance of  dozing  Chance  Situtation    Sitting and reading: 1    Watching TV: 1    Sitting Inactive in public: 0    As a passenger in car: 1      Lying down to rest: 1    Sitting and talking: 0    Sitting quielty after lunch: 0    In a car, stopped in traffic: 0   TOTAL SCORE:   4 out of 24    SLEEP STUDIES:  PSG - 12/25/2018 - AHI 83.7/hr, min Sp02 58% Titration - 01/01/2019 - CPAP @9cmH20    CPAP COMPLIANCE DATA:  Date Range: 07/16/2022-07/15/2023  Average Daily Use: 7 hours 39 minutes  Median Use: 7 hours  35 minutes  Compliance for > 4 Hours: 96%  AHI: 1.6 respiratory events per hour  Days Used: 357/365 days  Mask Leak: 4.9  95th Percentile Pressure: 9         LABS: Recent Results (from the past 2160 hours)  POC Covid19/Flu A&B Antigen     Status: Abnormal   Collection Time: 06/02/23  1:16 PM  Result Value Ref Range   Influenza A Antigen, POC Positive (A) Negative   Influenza B Antigen, POC Negative Negative   Covid Antigen, POC Negative Negative  CBC     Status: None   Collection Time: 07/17/23  8:13 AM  Result Value Ref Range   WBC 4.7 3.8 - 10.8 Thousand/uL   RBC 3.92 3.80 - 5.10 Million/uL   Hemoglobin 11.7 11.7 - 15.5 g/dL   HCT 16.1 09.6 - 04.5 %   MCV 90.1 80.0 - 100.0 fL   MCH 29.8 27.0 - 33.0 pg   MCHC 33.1 32.0 - 36.0 g/dL    Comment: For adults, a slight decrease in the calculated MCHC value (in the range of 30 to 32 g/dL) is most likely not clinically significant; however, it should be interpreted with caution in correlation with other red cell parameters and the patient's clinical condition.    RDW 12.8 11.0 - 15.0 %   Platelets 293 140 - 400 Thousand/uL   MPV 9.3 7.5 - 12.5 fL  COMPLETE METABOLIC PANEL WITH GFR     Status: None   Collection Time: 07/17/23  8:13 AM  Result Value Ref Range   Glucose, Bld 93 65 - 99 mg/dL    Comment: .            Fasting reference interval .    BUN 17 7 - 25 mg/dL   Creat 4.09 8.11 - 9.14  mg/dL   eGFR 98 > OR = 60 NW/GNF/6.21H0   BUN/Creatinine Ratio SEE NOTE: 6 - 22 (calc)    Comment:    Not Reported: BUN and Creatinine are within    reference range. .    Sodium 138 135 - 146 mmol/L   Potassium 4.3 3.5 - 5.3 mmol/L   Chloride 104 98 - 110 mmol/L   CO2 30 20 - 32 mmol/L   Calcium 9.1 8.6 - 10.4 mg/dL   Total Protein 7.1 6.1 - 8.1 g/dL   Albumin 4.3 3.6 - 5.1 g/dL   Globulin 2.8 1.9 - 3.7 g/dL (calc)   AG Ratio 1.5 1.0 - 2.5 (calc)   Total Bilirubin 0.4 0.2 - 1.2 mg/dL   Alkaline phosphatase (APISO) 77 37 - 153 U/L   AST 17 10 - 35 U/L   ALT 12 6 - 29 U/L  Lipid panel     Status: Abnormal   Collection Time: 07/17/23  8:13 AM  Result Value Ref Range   Cholesterol 215 (H) <200 mg/dL   HDL 61 > OR = 50 mg/dL   Triglycerides 865 <784 mg/dL   LDL Cholesterol (Calc) 130 (H) mg/dL (calc)    Comment: Reference range: <100 . Desirable range <100 mg/dL for primary prevention;   <70 mg/dL for patients with CHD or diabetic patients  with > or = 2 CHD risk factors. Marland Kitchen LDL-C is now calculated using the Martin-Hopkins  calculation, which is a validated novel method providing  better accuracy than the Friedewald equation in the  estimation of LDL-C.  Horald Pollen et al. Lenox Ahr. 6962;952(84): 2061-2068  (http://education.[employer].com/faq/FAQ164)    Total CHOL/HDL Ratio 3.5 <  5.0 (calc)   Non-HDL Cholesterol (Calc) 154 (H) <130 mg/dL (calc)    Comment: For patients with diabetes plus 1 major ASCVD risk  factor, treating to a non-HDL-C goal of <100 mg/dL  (LDL-C of <52 mg/dL) is considered a therapeutic  option.   Hemoglobin A1c     Status: None   Collection Time: 07/17/23  8:13 AM  Result Value Ref Range   Hgb A1c MFr Bld 5.5 <5.7 % of total Hgb    Comment: For the purpose of screening for the presence of diabetes: . <5.7%       Consistent with the absence of diabetes 5.7-6.4%    Consistent with increased risk for diabetes             (prediabetes) > or =6.5%   Consistent with diabetes . This assay result is consistent with a decreased risk of diabetes. . Currently, no consensus exists regarding use of hemoglobin A1c for diagnosis of diabetes in children. . According to American Diabetes Association (ADA) guidelines, hemoglobin A1c <7.0% represents optimal control in non-pregnant diabetic patients. Different metrics may apply to specific patient populations.  Standards of Medical Care in Diabetes(ADA). .    Mean Plasma Glucose 111 mg/dL   eAG (mmol/L) 6.2 mmol/L    Radiology: No results found.  No results found.  No results found.    Assessment and Plan: Patient Active Problem List   Diagnosis Date Noted   GERD (gastroesophageal reflux disease) 07/17/2023   Prediabetes 09/03/2022   Primary hypertension 09/03/2022   Primary osteoarthritis of both knees 06/30/2022   Anxiety as acute reaction to gross stress 06/30/2022   Class 1 obesity due to excess calories with body mass index (BMI) of 31.0 to 31.9 in adult 12/23/2021   Mixed hyperlipidemia 06/17/2021   Eczema 06/17/2021   OSA on CPAP 07/09/2020   1. OSA on CPAP (Primary) The patient does tolerate PAP and reports  benefit from PAP use. The patient was reminded how to clean equipment and advised to replace supplies routinely. The patient was also counselled on weight loss. The compliance is excellent. The AHI is 1.6.   OSA on cpap- controlled. Continue with excellent compliance with pap. CPAP continues to be medically necessary to treat this patient's OSA. F/u one year.    2. CPAP use counseling CPAP Counseling: had a lengthy discussion with the patient regarding the importance of PAP therapy in management of the sleep apnea. Patient appears to understand the risk factor reduction and also understands the risks associated with untreated sleep apnea. Patient will try to make a good faith effort to remain compliant with therapy. Also instructed the patient on proper cleaning of  the device including the water must be changed daily if possible and use of distilled water is preferred. Patient understands that the machine should be regularly cleaned with appropriate recommended cleaning solutions that do not damage the PAP machine for example given white vinegar and water rinses. Other methods such as ozone treatment may not be as good as these simple methods to achieve cleaning.   3. Class 1 obesity due to excess calories with serious comorbidity and body mass index (BMI) of 31.0 to 31.9 in adult Obesity Counseling: Had a lengthy discussion regarding patients BMI and weight issues. Patient was instructed on portion control as well as increased activity. Also discussed caloric restrictions with trying to maintain intake less than 2000 Kcal. Discussions were made in accordance with the 5As of weight management. Simple actions such as  not eating late and if able to, taking a walk is suggested.      General Counseling: I have discussed the findings of the evaluation and examination with Diana Rose.  I have also discussed any further diagnostic evaluation thatmay be needed or ordered today. Diana Rose verbalizes understanding of the findings of todays visit. We also reviewed her medications today and discussed drug interactions and side effects including but not limited excessive drowsiness and altered mental states. We also discussed that there is always a risk not just to her but also people around her. she has been encouraged to call the office with any questions or concerns that should arise related to todays visit.  No orders of the defined types were placed in this encounter.       I have personally obtained a history, examined the patient, evaluated laboratory and imaging results, formulated the assessment and plan and placed orders. This patient was seen today by Emmaline Kluver, PA-C in collaboration with Dr. Freda Munro.   Yevonne Pax, MD [facility] Diplomate ABMS Pulmonary Critical  Care Medicine and Sleep Medicine

## 2023-07-17 NOTE — Assessment & Plan Note (Signed)
 Avoid foods that trigger reflux Encourage weight loss as this can help reduce reflux symptoms Continue tums OTC as needed

## 2023-07-17 NOTE — Patient Instructions (Signed)

## 2023-07-17 NOTE — Assessment & Plan Note (Signed)
Encourage weight loss as this can help reduce joint pain Continue meloxicam C-Met today

## 2023-07-17 NOTE — Assessment & Plan Note (Signed)
 A1c today Encourage low-carb diet and exercise for weight loss

## 2023-07-17 NOTE — Assessment & Plan Note (Signed)
C-Met and lipid profile today Encouraged consume a low-fat diet Continue atorvastatin 

## 2023-07-17 NOTE — Assessment & Plan Note (Signed)
Continue buspirone as needed Support offered

## 2023-07-18 ENCOUNTER — Encounter: Payer: Self-pay | Admitting: Internal Medicine

## 2023-07-18 LAB — COMPLETE METABOLIC PANEL WITH GFR
AG Ratio: 1.5 (calc) (ref 1.0–2.5)
ALT: 12 U/L (ref 6–29)
AST: 17 U/L (ref 10–35)
Albumin: 4.3 g/dL (ref 3.6–5.1)
Alkaline phosphatase (APISO): 77 U/L (ref 37–153)
BUN: 17 mg/dL (ref 7–25)
CO2: 30 mmol/L (ref 20–32)
Calcium: 9.1 mg/dL (ref 8.6–10.4)
Chloride: 104 mmol/L (ref 98–110)
Creat: 0.72 mg/dL (ref 0.50–1.03)
Globulin: 2.8 g/dL (ref 1.9–3.7)
Glucose, Bld: 93 mg/dL (ref 65–99)
Potassium: 4.3 mmol/L (ref 3.5–5.3)
Sodium: 138 mmol/L (ref 135–146)
Total Bilirubin: 0.4 mg/dL (ref 0.2–1.2)
Total Protein: 7.1 g/dL (ref 6.1–8.1)
eGFR: 98 mL/min/{1.73_m2} (ref >=60)

## 2023-07-18 LAB — LIPID PANEL
Cholesterol: 215 mg/dL — ABNORMAL HIGH (ref <200)
HDL: 61 mg/dL (ref >=50)
LDL Cholesterol (Calc): 130 mg/dL — ABNORMAL HIGH
Non-HDL Cholesterol (Calc): 154 mg/dL — ABNORMAL HIGH (ref <130)
Total CHOL/HDL Ratio: 3.5 (calc) (ref <5.0)
Triglycerides: 125 mg/dL (ref <150)

## 2023-07-18 LAB — CBC
HCT: 35.3 % (ref 35.0–45.0)
Hemoglobin: 11.7 g/dL (ref 11.7–15.5)
MCH: 29.8 pg (ref 27.0–33.0)
MCHC: 33.1 g/dL (ref 32.0–36.0)
MCV: 90.1 fL (ref 80.0–100.0)
MPV: 9.3 fL (ref 7.5–12.5)
Platelets: 293 10*3/uL (ref 140–400)
RBC: 3.92 10*6/uL (ref 3.80–5.10)
RDW: 12.8 % (ref 11.0–15.0)
WBC: 4.7 10*3/uL (ref 3.8–10.8)

## 2023-07-18 LAB — HEMOGLOBIN A1C
Hgb A1c MFr Bld: 5.5 %{Hb} (ref <5.7)
Mean Plasma Glucose: 111 mg/dL
eAG (mmol/L): 6.2 mmol/L

## 2023-07-19 ENCOUNTER — Encounter: Payer: Self-pay | Admitting: Internal Medicine

## 2023-07-20 ENCOUNTER — Ambulatory Visit (INDEPENDENT_AMBULATORY_CARE_PROVIDER_SITE_OTHER): Admitting: Internal Medicine

## 2023-07-20 ENCOUNTER — Encounter: Payer: Self-pay | Admitting: Internal Medicine

## 2023-07-20 ENCOUNTER — Ambulatory Visit: Payer: BC Managed Care – PPO | Admitting: Internal Medicine

## 2023-07-20 VITALS — BP 112/70 | HR 70 | Resp 16 | Ht 60.0 in | Wt 162.0 lb

## 2023-07-20 DIAGNOSIS — Z7189 Other specified counseling: Secondary | ICD-10-CM | POA: Diagnosis not present

## 2023-07-20 DIAGNOSIS — E66811 Obesity, class 1: Secondary | ICD-10-CM

## 2023-07-20 DIAGNOSIS — G4733 Obstructive sleep apnea (adult) (pediatric): Secondary | ICD-10-CM

## 2023-07-20 DIAGNOSIS — Z6831 Body mass index (BMI) 31.0-31.9, adult: Secondary | ICD-10-CM

## 2023-07-20 DIAGNOSIS — E6609 Other obesity due to excess calories: Secondary | ICD-10-CM | POA: Diagnosis not present

## 2023-07-20 NOTE — Patient Instructions (Signed)

## 2023-07-29 DIAGNOSIS — M5451 Vertebrogenic low back pain: Secondary | ICD-10-CM | POA: Diagnosis not present

## 2023-07-29 DIAGNOSIS — M9901 Segmental and somatic dysfunction of cervical region: Secondary | ICD-10-CM | POA: Diagnosis not present

## 2023-07-29 DIAGNOSIS — M9903 Segmental and somatic dysfunction of lumbar region: Secondary | ICD-10-CM | POA: Diagnosis not present

## 2023-07-29 DIAGNOSIS — M25552 Pain in left hip: Secondary | ICD-10-CM | POA: Diagnosis not present

## 2023-07-29 DIAGNOSIS — M9904 Segmental and somatic dysfunction of sacral region: Secondary | ICD-10-CM | POA: Diagnosis not present

## 2023-09-07 ENCOUNTER — Other Ambulatory Visit: Payer: Self-pay | Admitting: Internal Medicine

## 2023-09-08 NOTE — Telephone Encounter (Signed)
 Requested medications are due for refill today.  unsure  Requested medications are on the active medications list.  yes  Last refill. 07/17/2023 30g 0 rf  Future visit scheduled.   yes  Notes to clinic.  Refill not delegated. Pt last seen 03/24/2023    Requested Prescriptions  Pending Prescriptions Disp Refills   triamcinolone  cream (KENALOG ) 0.1 % [Pharmacy Med Name: TRIAMCINOLONE  0.1% CREAM] 30 g 0    Sig: APPLY TOPICALLY TWICE A DAY     Not Delegated - Dermatology:  Corticosteroids Failed - 09/08/2023  5:48 PM      Failed - This refill cannot be delegated      Failed - Valid encounter within last 12 months    Recent Outpatient Visits           1 month ago Prediabetes   [facility] [place], [place], [place]

## 2023-09-15 ENCOUNTER — Other Ambulatory Visit: Payer: Self-pay | Admitting: Internal Medicine

## 2023-09-16 NOTE — Telephone Encounter (Signed)
 Lipid panel in date.   LOV 07/17/2023.   Requested Prescriptions  Pending Prescriptions Disp Refills   atorvastatin  (LIPITOR) 10 MG tablet [Pharmacy Med Name: ATORVASTATIN  10 MG TABLET] 90 tablet 0    Sig: TAKE 1 TABLET BY MOUTH EVERY DAY     Cardiovascular:  Antilipid - Statins Failed - 09/16/2023  2:40 PM      Failed - Valid encounter within last 12 months    Recent Outpatient Visits           2 months ago Prediabetes   [facility] [place], [place], NP              Failed - Lipid Panel in normal range within the last 12 months    Cholesterol  Date Value Ref Range Status  07/17/2023 215 (H) <200 mg/dL Final  16/02/9603 540 (H) 0 - 200 mg/dL Final   Ldl Cholesterol, Calc  Date Value Ref Range Status  02/03/2013 148 (H) 0 - 100 mg/dL Final   LDL Cholesterol (Calc)  Date Value Ref Range Status  07/17/2023 130 (H) mg/dL (calc) Final    Comment:    Reference range: <100 . Desirable range <100 mg/dL for primary prevention;   <70 mg/dL for patients with CHD or diabetic patients  with > or = 2 CHD risk factors. Aaron Aas LDL-C is now calculated using the Martin-Hopkins  calculation, which is a validated novel method providing  better accuracy than the Friedewald equation in the  estimation of LDL-C.  Melinda Sprawls et al. Erroll Heard. 9811;914(78): 2061-2068  (http://education.[facility].com/faq/FAQ164)    HDL Cholesterol  Date Value Ref Range Status  02/03/2013 56 40 - 60 mg/dL Final   HDL  Date Value Ref Range Status  07/17/2023 61 > OR = 50 mg/dL Final   Triglycerides  Date Value Ref Range Status  07/17/2023 125 <150 mg/dL Final  29/56/2130 865 0 - 200 mg/dL Final         Passed - Patient is not pregnant

## 2023-10-15 DIAGNOSIS — M9904 Segmental and somatic dysfunction of sacral region: Secondary | ICD-10-CM | POA: Diagnosis not present

## 2023-10-15 DIAGNOSIS — M5451 Vertebrogenic low back pain: Secondary | ICD-10-CM | POA: Diagnosis not present

## 2023-10-15 DIAGNOSIS — M9903 Segmental and somatic dysfunction of lumbar region: Secondary | ICD-10-CM | POA: Diagnosis not present

## 2023-10-15 DIAGNOSIS — M9901 Segmental and somatic dysfunction of cervical region: Secondary | ICD-10-CM | POA: Diagnosis not present

## 2023-10-15 DIAGNOSIS — M25552 Pain in left hip: Secondary | ICD-10-CM | POA: Diagnosis not present

## 2023-10-27 ENCOUNTER — Encounter: Payer: Self-pay | Admitting: Podiatry

## 2023-10-27 ENCOUNTER — Ambulatory Visit (INDEPENDENT_AMBULATORY_CARE_PROVIDER_SITE_OTHER)

## 2023-10-27 ENCOUNTER — Ambulatory Visit (INDEPENDENT_AMBULATORY_CARE_PROVIDER_SITE_OTHER): Admitting: Podiatry

## 2023-10-27 VITALS — Ht 60.0 in | Wt 162.0 lb

## 2023-10-27 DIAGNOSIS — R52 Pain, unspecified: Secondary | ICD-10-CM | POA: Diagnosis not present

## 2023-10-27 DIAGNOSIS — M2142 Flat foot [pes planus] (acquired), left foot: Secondary | ICD-10-CM

## 2023-10-27 DIAGNOSIS — M62462 Contracture of muscle, left lower leg: Secondary | ICD-10-CM

## 2023-10-27 NOTE — Progress Notes (Signed)
 Chief Complaint  Patient presents with   Foot Pain    Pt is here due to bilateral foot and ankle pain states she has had foot pain for years more of a aching/ throbbing pain has custom orthotics that doesn't really help with pain, ankle pain is constant thinks here ankles rolls in while walking.    HPI: 58 y.o. female presenting today for follow-up evaluation of chronic pain associated to the bilateral feet ongoing for several years now.  Patient states that she has had pain and tenderness associated to her feet chronically for several years and she has attempted multiple conservative treatments to alleviate the pain without any success.  She is very frustrated with the pain that she experiences on a daily basis.  She has tried custom orthotics and shoe gear modifications, oral anti-inflammatories, cortisone injections, chiropractic care, and physical therapy with no relief or improvement.  Past Medical History:  Diagnosis Date   Hyperlipidemia    Plantar fasciitis     Past Surgical History:  Procedure Laterality Date   TUBAL LIGATION      Allergies  Allergen Reactions   Asa [Aspirin] Other (See Comments)    burning in stomach      Physical Exam: General: The patient is alert and oriented x3 in no acute distress.  Dermatology: Skin is warm, dry and supple bilateral lower extremities. Negative for open lesions or macerations.  Vascular: Palpable pedal pulses bilaterally. Capillary refill within normal limits.  Negative for any significant edema or erythema  Neurological: Light touch and protective threshold grossly intact  Musculoskeletal Exam: With weightbearing there is complete collapse of the medial longitudinal arch of the foot and rear foot valgus deformity of the hindfoot.  Pain and tenderness along the posterior tibial tendon also noted bilateral left greater than the right. Range of motion WNL with exception of ankle joint dorsiflexion which demonstrates tight  posterior complex and gastrocnemius equinus  Radiographic Exam B/L foot and ankle 10/27/2023:  Normal osseous mineralization.  There does appear to be some moderate degenerative changes noted throughout the midtarsal joints bilateral feet.  Medial deviation of the talar head also noted on AP view foot. Rearfoot valgus deformity also noted  MR ANKLE RIGHT WO CONTRAST 06/02/2022: IMPRESSION: 1. Thickening of the posterior tibial tendon with surrounding edema suggesting tendinosis with tenosynovitis. 2. Ligaments of the medial and lateral ankle are intact. 3. No evidence of fracture or osteonecrosis. 4. 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  Assessment: 1.  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  Hindfoot arthritis bilateral 3.  Equinus deformity bilateral  Plan of Care:  -Patient evaluated.  Comprehensive diabetic foot exam was performed today -Unfortunately the patient has had chronic pain and tenderness to the feet for several years despite conservative treatment.  She has tried anti-inflammatory medication, custom orthotics, injections, with no lasting alleviation of symptoms.  She is very frustrated due to the chronic pain that she experiences on a daily basis. -I do believe at this time is appropriate to discuss surgery which would include triple arthrodesis with tendo Achilles lengthening to stabilize the rear foot.  The procedure was explained in detail in length to the patient.  All patient questions were answered.  No guarantees were expressed or implied.  Risk benefits advantages and disadvantages were explained as well as the postoperative recovery course.  The patient consents for surgery -Authorization for surgery was initiated today.  Surgery will consist of triple arthrodesis left.  Tendo Achilles lengthening left. -Order placed for knee scooter -Return to clinic 1 week postop  *Works for a [employer]       Thresa EMERSON Sar, DPM [facility]  Dr. Thresa EMERSON Sar, DPM    [address] [place], [place] [address]                Office [phone]  Fax [phone]

## 2023-10-28 ENCOUNTER — Encounter: Payer: Self-pay | Admitting: Podiatry

## 2023-11-03 NOTE — Telephone Encounter (Signed)
 lft mess on vmail for pt to confirm last day at work prior to surgery, so her forms can be completed.

## 2023-11-04 NOTE — Telephone Encounter (Signed)
 These are the correct notes  s/w pt last day of work 12/04/23- leave 12/07/23-01/11/24 approx. DOS 12/10/23. She will check to see if STD/LTD with HR. I gave my name/fax if another form is needed to be completed. faxed [employer] CLAUDE TRISH HARL and will email pt her copy

## 2023-11-04 NOTE — Telephone Encounter (Signed)
 Disregard prev notes

## 2023-11-04 NOTE — Telephone Encounter (Signed)
 pt lft mess on my vmail. I s/w her DOS 11/09/23 last day at work 11/18/23. RTW approx 12/21/23-may go back earlier-she sits. Will make decision after her POS visits. Faxing to [phone] and email pt her copy pt called me bk from prev mess

## 2023-11-13 NOTE — Telephone Encounter (Signed)
 pt lft mess on my vmail. I called bk and lft mess on her vmail and advised of my new ext [phone]

## 2023-11-14 ENCOUNTER — Other Ambulatory Visit: Payer: Self-pay | Admitting: Internal Medicine

## 2023-11-16 NOTE — Telephone Encounter (Signed)
 Requested Prescriptions  Pending Prescriptions Disp Refills   lisinopril  (ZESTRIL ) 10 MG tablet [Pharmacy Med Name: LISINOPRIL  10 MG TABLET] 90 tablet 0    Sig: TAKE 1 TABLET BY MOUTH EVERY DAY     Cardiovascular:  ACE Inhibitors Passed - 11/16/2023  3:41 PM      Passed - Cr in normal range and within 180 days    Creat  Date Value Ref Range Status  07/17/2023 0.72 0.50 - 1.03 mg/dL Final         Passed - K in normal range and within 180 days    Potassium  Date Value Ref Range Status  07/17/2023 4.3 3.5 - 5.3 mmol/L Final  02/03/2013 3.8 3.5 - 5.1 mmol/L Final         Passed - Patient is not pregnant      Passed - Last BP in normal range    BP Readings from Last 1 Encounters:  07/20/23 112/70         Passed - Valid encounter within last 6 months    Recent Outpatient Visits           4 months ago Prediabetes   [facility] [place], [place], [place]

## 2023-11-20 DIAGNOSIS — Z0279 Encounter for issue of other medical certificate: Secondary | ICD-10-CM

## 2023-11-23 ENCOUNTER — Ambulatory Visit: Admitting: Internal Medicine

## 2023-11-23 VITALS — BP 124/72 | HR 67 | Ht 60.0 in | Wt 165.6 lb

## 2023-11-23 DIAGNOSIS — R531 Weakness: Secondary | ICD-10-CM | POA: Diagnosis not present

## 2023-11-23 DIAGNOSIS — Z0271 Encounter for disability determination: Secondary | ICD-10-CM

## 2023-11-23 DIAGNOSIS — R42 Dizziness and giddiness: Secondary | ICD-10-CM | POA: Diagnosis not present

## 2023-11-23 DIAGNOSIS — R6889 Other general symptoms and signs: Secondary | ICD-10-CM

## 2023-11-23 DIAGNOSIS — R112 Nausea with vomiting, unspecified: Secondary | ICD-10-CM | POA: Diagnosis not present

## 2023-11-23 LAB — COMPREHENSIVE METABOLIC PANEL WITH GFR
AG Ratio: 1.7 (calc) (ref 1.0–2.5)
ALT: 16 U/L (ref 6–29)
AST: 18 U/L (ref 10–35)
Albumin: 4.5 g/dL (ref 3.6–5.1)
Alkaline phosphatase (APISO): 79 U/L (ref 37–153)
BUN: 25 mg/dL (ref 7–25)
CO2: 28 mmol/L (ref 20–32)
Calcium: 9.2 mg/dL (ref 8.6–10.4)
Chloride: 104 mmol/L (ref 98–110)
Creat: 0.94 mg/dL (ref 0.50–1.03)
Globulin: 2.7 g/dL (ref 1.9–3.7)
Glucose, Bld: 91 mg/dL (ref 65–99)
Potassium: 4.5 mmol/L (ref 3.5–5.3)
Sodium: 140 mmol/L (ref 135–146)
Total Bilirubin: 0.5 mg/dL (ref 0.2–1.2)
Total Protein: 7.2 g/dL (ref 6.1–8.1)
eGFR: 71 mL/min/1.73m2 (ref >=60)

## 2023-11-23 LAB — CBC
HCT: 38.6 % (ref 35.0–45.0)
Hemoglobin: 12.4 g/dL (ref 11.7–15.5)
MCH: 29.5 pg (ref 27.0–33.0)
MCHC: 32.1 g/dL (ref 32.0–36.0)
MCV: 91.7 fL (ref 80.0–100.0)
MPV: 9.5 fL (ref 7.5–12.5)
Platelets: 250 Thousand/uL (ref 140–400)
RBC: 4.21 Million/uL (ref 3.80–5.10)
RDW: 12.9 % (ref 11.0–15.0)
WBC: 5 Thousand/uL (ref 3.8–10.8)

## 2023-11-23 NOTE — Telephone Encounter (Signed)
 [employer] form. Faxed [phone] form/notes. DOS 12/10/23 OOW 12/07/23-01/11/24 approx

## 2023-11-23 NOTE — Patient Instructions (Signed)
Nausea, Adult Nausea is feeling like you may vomit. Feeling like you may vomit is usually not serious, but it may be an early sign of a more serious medical problem. Vomiting is when stomach contents forcefully come out of your mouth. If you vomit, or if you are not able to drink enough fluids, you may not have enough water in your body (get dehydrated). If you do not have enough water in your body, you may: Feel tired. Feel thirsty. Have a dry mouth. Have cracked lips. Pee (urinate) less often. Older adults and people who have other diseases or a weak body defense system (immune system) have a higher risk of not having enough water in the body. The main goals of treating this condition are: To relieve your nausea. To ensure your nausea occurs less often. To prevent vomiting and losing too much fluid. Follow these instructions at home: Watch your symptoms for any changes. Tell your doctor about them. Eating and drinking     Take an ORS (oral rehydration solution). This is a drink that is sold at pharmacies and stores. Drink clear fluids in small amounts as you are able. These include: Water. Ice chips. Fruit juice that has water added (diluted fruit juice). Low-calorie sports drinks. Eat bland, easy-to-digest foods in small amounts as you are able, such as: Bananas. Applesauce. Rice. Low-fat (lean) meats. Toast. Crackers. Avoid drinking fluids that have a lot of sugar or caffeine in them. This includes energy drinks, sports drinks, and soda. Avoid alcohol. Avoid spicy or fatty foods. General instructions Take over-the-counter and prescription medicines only as told by your doctor. Rest at home while you get better. Drink enough fluid to keep your pee (urine) pale yellow. Take slow and deep breaths when you feel like you may vomit. Avoid food or things that have strong smells. Wash your hands often with soap and water for at least 20 seconds. If you cannot use soap and water,  use hand sanitizer. Make sure that everyone in your home washes their hands well and often. Keep all follow-up visits. Contact a doctor if: You feel worse. You feel like you may vomit and this lasts for more than 2 days. You vomit. You are not able to drink fluids without vomiting. You have new symptoms. You have a fever. You have a headache. You have muscle cramps. You have a rash. You have pain while peeing. You feel light-headed or dizzy. Get help right away if: You have pain in your chest, neck, arm, or jaw. You feel very weak or you faint. You have vomit that is bright red or looks like coffee grounds. You have bloody or black poop (stools) or poop that looks like tar. You have a very bad headache, a stiff neck, or both. You have very bad pain, cramping, or bloating in your belly (abdomen). You have trouble breathing or you are breathing very quickly. Your heart is beating very quickly. Your skin feels cold and clammy. You feel confused. You have signs of losing too much water in your body, such as: Dark pee, very little pee, or no pee. Cracked lips. Dry mouth. Sunken eyes. Sleepiness. Weakness. These symptoms may be an emergency. Get help right away. Call 911. Do not wait to see if the symptoms will go away. Do not drive yourself to the hospital. Summary Nausea is feeling like you are about vomit. If you vomit, or if you are not able to drink enough fluids, you may not have enough water in   your body (get dehydrated). Eat and drink what your doctor tells you. Take over-the-counter and prescription medicines only as told by your doctor. Contact a doctor right away if your symptoms get worse or you have new symptoms. Keep all follow-up visits. This information is not intended to replace advice given to you by your health care provider. Make sure you discuss any questions you have with your health care provider. Document Revised: 10/26/2020 Document Reviewed:  10/26/2020 Elsevier Patient Education  2024 Elsevier Inc.  

## 2023-11-23 NOTE — Progress Notes (Signed)
 Subjective:    Patient ID: Diana Rose, female    DOB: 06/13/1965, 58 y.o.   MRN: [id number]  HPI  Discussed the use of AI scribe software for clinical note transcription with the patient, who gave verbal consent to proceed.   Diana Rose is a 58 year old female who presents with dizziness, nausea, and sweating.  She experienced an acute episode of dizziness, nausea, and sweating upon waking up at 6:30 AM last Thursday. She vomited a small amount of acid-like substance but did not consume any unusual food the night before. She felt weak, spending several hours recovering in the living room. The episode resolved within a few hours, and she has not had recurrent symptoms.  Over the past few months, she has experienced some difficulty catching her breath but no chest pain. She has a history of acid reflux but is unsure if her recent symptoms are related. She has not experienced similar episodes before and was scared by the intensity of the symptoms.  She has been feeling better since Friday and was able to work that day, although she felt too weak to work on Thursday. No one around her has been sick with similar symptoms. No fever, diarrhea, abdominal pain, cramping, blood in stool, upper respiratory symptoms, ear pain, cough, shortness of breath, or chest pain.       Review of Systems  Past Medical History:  Diagnosis Date   Hyperlipidemia    Plantar fasciitis     Current Outpatient Medications  Medication Sig Dispense Refill   atorvastatin  (LIPITOR) 10 MG tablet TAKE 1 TABLET BY MOUTH EVERY DAY 90 tablet 0   busPIRone  (BUSPAR ) 5 MG tablet TAKE 1 TABLET BY MOUTH DAILY AS NEEDED. 90 tablet 3   fluticasone  (FLONASE ) 50 MCG/ACT nasal spray Place 1 spray into both nostrils daily. 16 g 0   lisinopril  (ZESTRIL ) 10 MG tablet TAKE 1 TABLET BY MOUTH EVERY DAY 90 tablet 0   meloxicam  (MOBIC ) 15 MG tablet Take 1 tablet (15 mg total) by mouth daily. 90 tablet 1   mupirocin   ointment (BACTROBAN ) 2 % Apply 1 Application topically 2 (two) times daily. 22 g 0   Probiotic Product (PROBIOTIC BLEND PO) Take by mouth.     triamcinolone  cream (KENALOG ) 0.1 % APPLY TOPICALLY TWICE A DAY 30 g 0   No current facility-administered medications for this visit.    Allergies  Allergen Reactions   Asa [Aspirin] Other (See Comments)    burning in stomach     Family History  Problem Relation Age of Onset   Fibromyalgia Mother    Heart attack Mother        80s   Hypertension Mother    Congestive Heart Failure Mother    Other Father        unknown medical history   Bipolar disorder Sister     Social History   Socioeconomic History   Marital status: Legally Separated    Spouse name: Not on file   Number of children: Not on file   Years of education: Not on file   Highest education level: 12th grade  Occupational History   Not on file  Tobacco Use   Smoking status: Former    Types: Cigarettes   Smokeless tobacco: Never  Vaping Use   Vaping status: Never Used  Substance and Sexual Activity   Alcohol use: Yes    Comment: occassional   Drug use: Never   Sexual activity: Not on  file  Other Topics Concern   Not on file  Social History Narrative   Not on file   Social Drivers of Health   Financial Resource Strain: Low Risk  (07/17/2023)   Overall Financial Resource Strain (CARDIA)    Difficulty of Paying Living Expenses: Not hard at all  Food Insecurity: No Food Insecurity (07/17/2023)   Hunger Vital Sign    Worried About Running Out of Food in the Last Year: Never true    Ran Out of Food in the Last Year: Never true  Transportation Needs: No Transportation Needs (07/17/2023)   PRAPARE - Administrator, Civil Service (Medical): No    Lack of Transportation (Non-Medical): No  Physical Activity: Insufficiently Active (07/17/2023)   Exercise Vital Sign    Days of Exercise per Week: 3 days    Minutes of Exercise per Session: 30 min  Stress: No  Stress Concern Present (07/17/2023)   [employer] of Occupational Health - Occupational Stress Questionnaire    Feeling of Stress : Not at all  Social Connections: Moderately Isolated (07/17/2023)   Social Connection and Isolation Panel    Frequency of Communication with Friends and Family: Once a week    Frequency of Social Gatherings with Friends and Family: Twice a week    Attends Religious Services: 1 to 4 times per year    Active Member of [employer] or Organizations: No    Attends Engineer, structural: Not on file    Marital Status: Divorced  Intimate Partner Violence: Not on file     Constitutional: Denies fever, malaise, fatigue, headache or abrupt weight changes.  HEENT: Denies eye pain, eye redness, ear pain, ringing in the ears, wax buildup, runny nose, nasal congestion, bloody nose, or sore throat. Respiratory: Patient reports intermittent difficulty catching her breath.  Denies difficulty breathing, shortness of breath, cough or sputum production.   Cardiovascular: Denies chest pain, chest tightness, palpitations or swelling in the hands or feet.  Gastrointestinal: Pt reports intermittent reflux, nausea, vomiting. Denies abdominal pain, bloating, constipation, diarrhea or blood in the stool.  GU: Denies urgency, frequency, pain with urination, burning sensation, blood in urine, odor or discharge. Musculoskeletal: Patient reports intermittent knee pain, chronic right foot and ankle pain.  Denies decrease in range of motion, difficulty with gait, muscle pain or joint swelling.  Skin: Patient reports dry skin, sweating.  Denies redness, rashes, lesions or ulcercations.  Neurological: Patient reports intermittent dizziness.  Denies difficulty with memory, difficulty with speech or problems with balance and coordination.  Psych: Pt has a history of anxiety. Denies depression, SI/HI.  No other specific complaints in a complete review of systems (except as listed in HPI  above).     Objective:   Physical Exam  BP 124/72 (BP Location: Left Arm, Patient Position: Sitting, Cuff Size: Normal)   Pulse 67   Ht 5' (1.524 m)   Wt 165 lb 9.6 oz (75.1 kg)   SpO2 98%   BMI 32.34 kg/m     Wt Readings from Last 3 Encounters:  10/27/23 162 lb (73.5 kg)  07/20/23 162 lb (73.5 kg)  07/17/23 163 lb 3.2 oz (74 kg)    General: Appears her stated age, obese, in NAD. Skin: Warm, dry and intact.   HEENT: Head: normal shape and size; Eyes: sclera white, no icterus, conjunctiva pink, PERRLA and EOMs intact;  Cardiovascular: Normal rate and rhythm. S1,S2 noted.  No murmur, rubs or gallops noted.  Pulmonary/Chest: Normal effort and  positive vesicular breath sounds. No respiratory distress. No wheezes, rales or ronchi noted.  Abdomen: Soft and nontender.  Normal bowel sounds. Musculoskeletal: No difficulty with gait.  Neurological: Alert and oriented. Coordination normal.    BMET    Component Value Date/Time   NA 138 07/17/2023 0813   NA 138 02/03/2013 0911   K 4.3 07/17/2023 0813   K 3.8 02/03/2013 0911   CL 104 07/17/2023 0813   CL 104 02/03/2013 0911   CO2 30 07/17/2023 0813   CO2 30 02/03/2013 0911   GLUCOSE 93 07/17/2023 0813   GLUCOSE 90 02/03/2013 0911   BUN 17 07/17/2023 0813   BUN 13 02/03/2013 0911   CREATININE 0.72 07/17/2023 0813   CALCIUM  9.1 07/17/2023 0813   CALCIUM  9.1 02/03/2013 0911   GFRNONAA >60 02/03/2013 0911   GFRAA >60 02/03/2013 0911    Lipid Panel     Component Value Date/Time   CHOL 215 (H) 07/17/2023 0813   CHOL 244 (H) 02/03/2013 0911   TRIG 125 07/17/2023 0813   TRIG 199 02/03/2013 0911   HDL 61 07/17/2023 0813   HDL 56 02/03/2013 0911   CHOLHDL 3.5 07/17/2023 0813   VLDL 40 02/03/2013 0911   LDLCALC 130 (H) 07/17/2023 0813   LDLCALC 148 (H) 02/03/2013 0911    CBC    Component Value Date/Time   WBC 4.7 07/17/2023 0813   RBC 3.92 07/17/2023 0813   HGB 11.7 07/17/2023 0813   HGB 13.4 02/03/2013 0911   HCT  35.3 07/17/2023 0813   HCT 39.2 02/03/2013 0911   PLT 293 07/17/2023 0813   PLT 279 02/03/2013 0911   MCV 90.1 07/17/2023 0813   MCV 88 02/03/2013 0911   MCH 29.8 07/17/2023 0813   MCHC 33.1 07/17/2023 0813   RDW 12.8 07/17/2023 0813   RDW 12.7 02/03/2013 0911   LYMPHSABS 1.6 02/03/2013 0911   MONOABS 0.5 02/03/2013 0911   EOSABS 0.1 02/03/2013 0911   BASOSABS 0.0 02/03/2013 0911    Hgb A1C Lab Results  Component Value Date   HGBA1C 5.5 07/17/2023           Assessment & Plan:  Assessment and Plan    Acute episode of dizziness, nausea and vomiting Suspected transient viral illness due to lack of recurrent symptoms and resolution of episode. - Order CBC and CMP to rule out dehydration or elevated WBC count. - Monitor for reoccurrence or worsening symptoms  Intermittent dyspnea Etiology unclear, not related to acute cardiac or respiratory conditions. - Monitor symptoms for now    RTC in 2 months for your annual exam Angeline Laura, NP

## 2023-11-24 ENCOUNTER — Ambulatory Visit: Payer: Self-pay | Admitting: Internal Medicine

## 2023-11-30 ENCOUNTER — Telehealth: Payer: Self-pay | Admitting: Podiatry

## 2023-11-30 NOTE — Telephone Encounter (Signed)
 I checked [employer] health and the order for the knee scooter was canceled. I called pt and she said that insurance is not covering it and she is going to buy one herself. The price the dme company wants for her to use for 4 months is more than she could just buy one herself.

## 2023-11-30 NOTE — Telephone Encounter (Signed)
 Pt called and has upcoming surgery and a knee scooter was ordered and she is asking if she would be needing crutches and a transition chair for the house.  I told her all that was ordered was the knee scooter but I would ask about the other items.

## 2023-12-03 ENCOUNTER — Telehealth: Payer: Self-pay | Admitting: Podiatry

## 2023-12-03 NOTE — Telephone Encounter (Signed)
 DOS- 12/10/2023  TRIPLE ARTHRODESIS + BLOCK LT- 71284 TENDO-ACHILLES LENGTH LT- 72314  BCBS EFFECTIVE DATE- 05/06/2023  DEDUCTIBLE- $1650 REMAINING- $241.56 OOP- $4000 REMAINING- $2591.56 COINSURANCE- 20% PER SERVICE YR NO COPAY  PER TASHA P WITH BCBS, NO PRIOR AUTHS ARE REQUIRED FOR CPT CODES 71284 AND [phone]. REF # [id number] 12/03/2023 10:09 AM EST

## 2023-12-08 ENCOUNTER — Encounter: Payer: Self-pay | Admitting: Podiatry

## 2023-12-08 ENCOUNTER — Telehealth: Payer: Self-pay | Admitting: Podiatry

## 2023-12-08 DIAGNOSIS — M9904 Segmental and somatic dysfunction of sacral region: Secondary | ICD-10-CM | POA: Diagnosis not present

## 2023-12-08 DIAGNOSIS — M9901 Segmental and somatic dysfunction of cervical region: Secondary | ICD-10-CM | POA: Diagnosis not present

## 2023-12-08 DIAGNOSIS — M9903 Segmental and somatic dysfunction of lumbar region: Secondary | ICD-10-CM | POA: Diagnosis not present

## 2023-12-08 DIAGNOSIS — M5451 Vertebrogenic low back pain: Secondary | ICD-10-CM | POA: Diagnosis not present

## 2023-12-08 NOTE — Telephone Encounter (Signed)
 Pt left message today asking about physical therapy after her surgery.  She sent a my chart message as well. Please advise

## 2023-12-10 ENCOUNTER — Other Ambulatory Visit: Payer: Self-pay | Admitting: Podiatry

## 2023-12-10 DIAGNOSIS — M19072 Primary osteoarthritis, left ankle and foot: Secondary | ICD-10-CM | POA: Diagnosis not present

## 2023-12-10 DIAGNOSIS — G8918 Other acute postprocedural pain: Secondary | ICD-10-CM | POA: Diagnosis not present

## 2023-12-10 DIAGNOSIS — M2142 Flat foot [pes planus] (acquired), left foot: Secondary | ICD-10-CM | POA: Diagnosis not present

## 2023-12-10 DIAGNOSIS — M62462 Contracture of muscle, left lower leg: Secondary | ICD-10-CM | POA: Diagnosis not present

## 2023-12-10 MED ORDER — IBUPROFEN 800 MG PO TABS: 800.0000 mg | ORAL_TABLET | Freq: Three times a day (TID) | ORAL | 1 refills | Status: DC

## 2023-12-10 MED ORDER — OXYCODONE-ACETAMINOPHEN 10-325 MG PO TABS: 1.0000 | ORAL_TABLET | ORAL | 0 refills | Status: DC | PRN

## 2023-12-10 NOTE — Progress Notes (Signed)
 PRN postop

## 2023-12-18 ENCOUNTER — Ambulatory Visit (INDEPENDENT_AMBULATORY_CARE_PROVIDER_SITE_OTHER)

## 2023-12-18 ENCOUNTER — Ambulatory Visit (INDEPENDENT_AMBULATORY_CARE_PROVIDER_SITE_OTHER): Admitting: Podiatry

## 2023-12-18 ENCOUNTER — Encounter: Payer: Self-pay | Admitting: Podiatry

## 2023-12-18 VITALS — Ht 60.0 in | Wt 165.6 lb

## 2023-12-18 DIAGNOSIS — M2142 Flat foot [pes planus] (acquired), left foot: Secondary | ICD-10-CM

## 2023-12-18 DIAGNOSIS — M62462 Contracture of muscle, left lower leg: Secondary | ICD-10-CM | POA: Diagnosis not present

## 2023-12-18 MED ORDER — DOXYCYCLINE HYCLATE 100 MG PO TABS: 100.0000 mg | ORAL_TABLET | Freq: Two times a day (BID) | ORAL | 0 refills | Status: DC

## 2023-12-18 NOTE — Progress Notes (Signed)
   Chief Complaint  Patient presents with   Routine Post Op    POV # 1 DOS 12/10/23 LT TRIPLE ARTHRODESIS, LT TENDOACHILLES LENGTHENING    Subjective:  Patient presents today status post triple arthrodesis with tendo Achilles lengthening left.  DOS: 12/10/2023.  Patient is doing well.  She is not taking any pain medication other than ibuprofen .  She says the pain has been very tolerable and minimal throughout the week.  Currently NWB in a cam boot on a knee scooter  Past Medical History:  Diagnosis Date   Hyperlipidemia    Plantar fasciitis     Past Surgical History:  Procedure Laterality Date   TUBAL LIGATION      Allergies  Allergen Reactions   Asa [Aspirin] Other (See Comments)    burning in stomach     Objective/Physical Exam Neurovascular status intact.  Incision well coapted with sutures and staples intact.  Moderate edema noted to the surgical extremity.  No active bleeding or dehiscence.  There is some very mild localized erythema with edema to the forefoot around the 2nd and 3rd MTP  Radiographic Exam LT foot and ankle 12/18/2023:  Orthopedic hardware and arthrodesis sites appear to be stable with routine healing.  Assessment: 1. s/p triple arthrodesis with tendo Achilles lengthening left. DOS: 12/10/2023   Plan of Care:  -Patient was evaluated. X-rays reviewed - Dressings changed.  Leave clean dry and intact x 1 week -Continue NWB in the cam boot using the knee scooter -Due to the mild erythema I did send in a prescription for doxycycline  100 mg twice daily x 7 days -Return to clinic 1 week   Thresa EMERSON Sar, DPM [facility]  Dr. Thresa EMERSON Sar, DPM    [address] [place], [place] [address]                Office [phone]  Fax [phone]

## 2023-12-25 ENCOUNTER — Ambulatory Visit: Admitting: Podiatry

## 2023-12-25 ENCOUNTER — Encounter: Payer: Self-pay | Admitting: Podiatry

## 2023-12-25 VITALS — Ht 60.0 in | Wt 165.6 lb

## 2023-12-25 DIAGNOSIS — M2142 Flat foot [pes planus] (acquired), left foot: Secondary | ICD-10-CM

## 2023-12-25 NOTE — Progress Notes (Signed)
   Chief Complaint  Patient presents with   Routine Post Op    POV # 2 DOS 12/10/23 LT TRIPLE ARTHRODESIS, LT TENDOACHILLES LENGTHENING, pt states everything is going well, has a little discomfort, contiunes to wear cam boot and knee scooter to ambulate.     Subjective:  Patient presents today status post triple arthrodesis with tendo Achilles lengthening left.  DOS: 12/10/2023.  Patient is doing well.  She is almost completed the oral antibiotic.  She is continuing to be NWB in the cam boot using the knee scooter  Past Medical History:  Diagnosis Date   Hyperlipidemia    Plantar fasciitis     Past Surgical History:  Procedure Laterality Date   TUBAL LIGATION      Allergies  Allergen Reactions   Asa [Aspirin] Other (See Comments)    burning in stomach     Objective/Physical Exam Neurovascular status intact.  Incision well coapted with sutures and staples intact.  Significantly improved minimal edema noted to the surgical extremity.  No active bleeding or dehiscence.  The erythema around the forefoot is completely resolved Radiographic Exam LT foot and ankle 12/18/2023:  Orthopedic hardware and arthrodesis sites appear to be stable with routine healing.  Assessment: 1. s/p triple arthrodesis with tendo Achilles lengthening left. DOS: 12/10/2023   Plan of Care:  -Patient was evaluated.   -Okay to wash and shower and get the foot wet.  She has a shower chair -Continue NWB CAM boot using the knee scooter -Return to clinic 1 week for staple removal  Thresa EMERSON Sar, DPM [facility]  Dr. Thresa EMERSON Sar, DPM    [address] [place], [place] [address]                Office [phone]  Fax [phone]

## 2023-12-25 NOTE — Telephone Encounter (Signed)
 Recd [facility] forms from [place] office. I faxed to [phone] form/notes.Approx RTW 01/11/24

## 2024-01-01 ENCOUNTER — Ambulatory Visit (INDEPENDENT_AMBULATORY_CARE_PROVIDER_SITE_OTHER): Admitting: Podiatry

## 2024-01-01 ENCOUNTER — Encounter: Payer: Self-pay | Admitting: Podiatry

## 2024-01-01 VITALS — Ht 60.0 in | Wt 165.6 lb

## 2024-01-01 DIAGNOSIS — M2142 Flat foot [pes planus] (acquired), left foot: Secondary | ICD-10-CM

## 2024-01-01 NOTE — Progress Notes (Signed)
   Chief Complaint  Patient presents with   Routine Post Op     POV #3 DOS 12/10/23 LT TRIPLE ARTHRODESIS, LT TENDOACHILLES LENGTHENING, pt states everything is going well, still wearing cam boot and knee scooter to ambulate, ready to get staples out.     Subjective:  Patient presents today status post triple arthrodesis with tendo Achilles lengthening left.  DOS: 12/10/2023.  Continues to do well.  No pain.  She has been nonweightbearing in the cam boot as instructed  Past Medical History:  Diagnosis Date   Hyperlipidemia    Plantar fasciitis     Past Surgical History:  Procedure Laterality Date   TUBAL LIGATION      Allergies  Allergen Reactions   Asa [Aspirin] Other (See Comments)    burning in stomach     Objective/Physical Exam Neurovascular status intact.  Incision well coapted with sutures and staples intact.  Significantly improved minimal edema noted to the surgical extremity.  No active bleeding or dehiscence.  The erythema around the forefoot is completely resolved Radiographic Exam LT foot and ankle 12/18/2023:  Orthopedic hardware and arthrodesis sites appear to be stable with routine healing.  Assessment: 1. s/p triple arthrodesis with tendo Achilles lengthening left. DOS: 12/10/2023   Plan of Care:  -Patient was evaluated.   - Staples and sutures removed -Continue Ace wrap with Cam boot -Continue NWB using the knee scooter -Return to clinic 4 weeks follow-up x-ray and to possibly initiate physical therapy and weightbearing in the boot  Thresa EMERSON Sar, DPM [facility]  Dr. Thresa EMERSON Sar, DPM    [address] [place], [place] [address]                Office [phone]  Fax [phone]

## 2024-01-08 ENCOUNTER — Encounter: Admitting: Podiatry

## 2024-01-12 ENCOUNTER — Encounter: Payer: Self-pay | Admitting: Podiatry

## 2024-01-12 NOTE — Telephone Encounter (Signed)
 Adv pt via MyChart and email we would revise her RTW date until 02/02/24 she is still not walking. It needs to be faxed to [phone]. I adv her of faxing tomorrow because there are several patient req before hers. Forms/notes/updates are done in the order in which they are received.

## 2024-01-13 ENCOUNTER — Encounter: Payer: Self-pay | Admitting: Podiatry

## 2024-01-19 ENCOUNTER — Other Ambulatory Visit: Payer: Self-pay | Admitting: Medical Genetics

## 2024-01-21 ENCOUNTER — Encounter: Payer: Self-pay | Admitting: Internal Medicine

## 2024-01-21 NOTE — Telephone Encounter (Signed)
 Per pt's email she needs a copy of [facility] note to be faxed to [employer] [phone]. I adv her via email will do today

## 2024-01-22 NOTE — Telephone Encounter (Signed)
 Emailed the pt to adv her of form recd from [employer] and the $25 fee must be paid prior to form being filled out.

## 2024-01-25 ENCOUNTER — Ambulatory Visit (INDEPENDENT_AMBULATORY_CARE_PROVIDER_SITE_OTHER): Admitting: Internal Medicine

## 2024-01-25 ENCOUNTER — Encounter: Payer: Self-pay | Admitting: Internal Medicine

## 2024-01-25 VITALS — BP 118/72 | Ht 60.0 in | Wt 165.0 lb

## 2024-01-25 DIAGNOSIS — R7303 Prediabetes: Secondary | ICD-10-CM

## 2024-01-25 DIAGNOSIS — Z6832 Body mass index (BMI) 32.0-32.9, adult: Secondary | ICD-10-CM

## 2024-01-25 DIAGNOSIS — E66811 Obesity, class 1: Secondary | ICD-10-CM

## 2024-01-25 DIAGNOSIS — Z0001 Encounter for general adult medical examination with abnormal findings: Secondary | ICD-10-CM | POA: Diagnosis not present

## 2024-01-25 DIAGNOSIS — Z0279 Encounter for issue of other medical certificate: Secondary | ICD-10-CM

## 2024-01-25 DIAGNOSIS — E6609 Other obesity due to excess calories: Secondary | ICD-10-CM

## 2024-01-25 DIAGNOSIS — Z1231 Encounter for screening mammogram for malignant neoplasm of breast: Secondary | ICD-10-CM | POA: Diagnosis not present

## 2024-01-25 DIAGNOSIS — E782 Mixed hyperlipidemia: Secondary | ICD-10-CM | POA: Diagnosis not present

## 2024-01-25 NOTE — Assessment & Plan Note (Signed)
 Encourage diet and exercise for weight loss

## 2024-01-25 NOTE — Patient Instructions (Signed)
 Health Maintenance for Postmenopausal Women Menopause is a normal process in which your ability to get pregnant comes to an end. This process happens slowly over many months or years, usually between the ages of 76 and 38. Menopause is complete when you have missed your menstrual period for 12 months. It is important to talk with your health care provider about some of the most common conditions that affect women after menopause (postmenopausal women). These include heart disease, cancer, and bone loss (osteoporosis). Adopting a healthy lifestyle and getting preventive care can help to promote your health and wellness. The actions you take can also lower your chances of developing some of these common conditions. What are the signs and symptoms of menopause? During menopause, you may have the following symptoms: Hot flashes. These can be moderate or severe. Night sweats. Decrease in sex drive. Mood swings. Headaches. Tiredness (fatigue). Irritability. Memory problems. Problems falling asleep or staying asleep. Talk with your health care provider about treatment options for your symptoms. Do I need hormone replacement therapy? Hormone replacement therapy is effective in treating symptoms that are caused by menopause, such as hot flashes and night sweats. Hormone replacement carries certain risks, especially as you become older. If you are thinking about using estrogen or estrogen with progestin, discuss the benefits and risks with your health care provider. How can I reduce my risk for heart disease and stroke? The risk of heart disease, heart attack, and stroke increases as you age. One of the causes may be a change in the body's hormones during menopause. This can affect how your body uses dietary fats, triglycerides, and cholesterol. Heart attack and stroke are medical emergencies. There are many things that you can do to help prevent heart disease and stroke. Watch your blood pressure High  blood pressure causes heart disease and increases the risk of stroke. This is more likely to develop in people who have high blood pressure readings or are overweight. Have your blood pressure checked: Every 3-5 years if you are 32-23 years of age. Every year if you are 31 years old or older. Eat a healthy diet  Eat a diet that includes plenty of vegetables, fruits, low-fat dairy products, and lean protein. Do not eat a lot of foods that are high in solid fats, added sugars, or sodium. Get regular exercise Get regular exercise. This is one of the most important things you can do for your health. Most adults should: Try to exercise for at least 150 minutes each week. The exercise should increase your heart rate and make you sweat (moderate-intensity exercise). Try to do strengthening exercises at least twice each week. Do these in addition to the moderate-intensity exercise. Spend less time sitting. Even light physical activity can be beneficial. Other tips Work with your health care provider to achieve or maintain a healthy weight. Do not use any products that contain nicotine or tobacco. These products include cigarettes, chewing tobacco, and vaping devices, such as e-cigarettes. If you need help quitting, ask your health care provider. Know your numbers. Ask your health care provider to check your cholesterol and your blood sugar (glucose). Continue to have your blood tested as directed by your health care provider. Do I need screening for cancer? Depending on your health history and family history, you may need to have cancer screenings at different stages of your life. This may include screening for: Breast cancer. Cervical cancer. Lung cancer. Colorectal cancer. What is my risk for osteoporosis? After menopause, you may be  at increased risk for osteoporosis. Osteoporosis is a condition in which bone destruction happens more quickly than new bone creation. To help prevent osteoporosis or  the bone fractures that can happen because of osteoporosis, you may take the following actions: If you are 24-54 years old, get at least 1,000 mg of calcium and at least 600 international units (IU) of vitamin D  per day. If you are older than age 75 but younger than age 30, get at least 1,200 mg of calcium and at least 600 international units (IU) of vitamin D  per day. If you are older than age 8, get at least 1,200 mg of calcium and at least 800 international units (IU) of vitamin D  per day. Smoking and drinking excessive alcohol increase the risk of osteoporosis. Eat foods that are rich in calcium and vitamin D , and do weight-bearing exercises several times each week as directed by your health care provider. How does menopause affect my mental health? Depression may occur at any age, but it is more common as you become older. Common symptoms of depression include: Feeling depressed. Changes in sleep patterns. Changes in appetite or eating patterns. Feeling an overall lack of motivation or enjoyment of activities that you previously enjoyed. Frequent crying spells. Talk with your health care provider if you think that you are experiencing any of these symptoms. General instructions See your health care provider for regular wellness exams and vaccines. This may include: Scheduling regular health, dental, and eye exams. Getting and maintaining your vaccines. These include: Influenza vaccine. Get this vaccine each year before the flu season begins. Pneumonia vaccine. Shingles vaccine. Tetanus, diphtheria, and pertussis (Tdap) booster vaccine. Your health care provider may also recommend other immunizations. Tell your health care provider if you have ever been abused or do not feel safe at home. Summary Menopause is a normal process in which your ability to get pregnant comes to an end. This condition causes hot flashes, night sweats, decreased interest in sex, mood swings, headaches, or lack  of sleep. Treatment for this condition may include hormone replacement therapy. Take actions to keep yourself healthy, including exercising regularly, eating a healthy diet, watching your weight, and checking your blood pressure and blood sugar levels. Get screened for cancer and depression. Make sure that you are up to date with all your vaccines. This information is not intended to replace advice given to you by your health care provider. Make sure you discuss any questions you have with your health care provider. Document Revised: 09/10/2020 Document Reviewed: 09/10/2020 Elsevier Patient Education  2024 [employer].

## 2024-01-25 NOTE — Progress Notes (Signed)
 Subjective:    Patient ID: Diana Rose, female    DOB: 1965/09/07, 58 y.o.   MRN: [id number]  HPI  Patient presents to clinic today for her annual exam.  Flu: 12/2022 Tetanus: 06/2021 COVID: X 2 Shingrix : 12/2022, 03/2023 Pap smear: 12/2021, ASCUS, negative HPV, repeat in 3 years Mammogram: 03/2023 Colon screening: 09/2017 Vision screening: annually Dentist: biannually  Diet: She does eat meat. She consumes fruits and veggies. She does eat some fried foods. She drinks mostly water and coffee. Exercise: stretching 1 x week  Review of Systems     Past Medical History:  Diagnosis Date   Hyperlipidemia    Plantar fasciitis     Current Outpatient Medications  Medication Sig Dispense Refill   atorvastatin  (LIPITOR) 10 MG tablet TAKE 1 TABLET BY MOUTH EVERY DAY 90 tablet 0   busPIRone  (BUSPAR ) 5 MG tablet TAKE 1 TABLET BY MOUTH DAILY AS NEEDED. 90 tablet 3   doxycycline  (VIBRA -TABS) 100 MG tablet Take 1 tablet (100 mg total) by mouth 2 (two) times daily. 14 tablet 0   fluticasone  (FLONASE ) 50 MCG/ACT nasal spray Place 1 spray into both nostrils daily. 16 g 0   ibuprofen  (ADVIL ) 800 MG tablet Take 1 tablet (800 mg total) by mouth 3 (three) times daily. 60 tablet 1   lisinopril  (ZESTRIL ) 10 MG tablet TAKE 1 TABLET BY MOUTH EVERY DAY 90 tablet 0   meloxicam  (MOBIC ) 15 MG tablet Take 1 tablet (15 mg total) by mouth daily. 90 tablet 1   oxyCODONE -acetaminophen  (PERCOCET) 10-325 MG tablet Take 1 tablet by mouth every 4 (four) hours as needed for pain. 30 tablet 0   Probiotic Product (PROBIOTIC BLEND PO) Take by mouth.     triamcinolone  cream (KENALOG ) 0.1 % APPLY TOPICALLY TWICE A DAY 30 g 0   No current facility-administered medications for this visit.    Allergies  Allergen Reactions   Asa [Aspirin] Other (See Comments)    burning in stomach     Family History  Problem Relation Age of Onset   Fibromyalgia Mother    Heart attack Mother        34s   Hypertension Mother     Congestive Heart Failure Mother    Other Father        unknown medical history   Bipolar disorder Sister     Social History   Socioeconomic History   Marital status: Legally Separated    Spouse name: Not on file   Number of children: Not on file   Years of education: Not on file   Highest education level: 12th grade  Occupational History   Not on file  Tobacco Use   Smoking status: Former    Types: Cigarettes   Smokeless tobacco: Never  Vaping Use   Vaping status: Never Used  Substance and Sexual Activity   Alcohol use: Yes    Comment: occassional   Drug use: Never   Sexual activity: Not on file  Other Topics Concern   Not on file  Social History Narrative   Not on file   Social Drivers of Health   Financial Resource Strain: Low Risk  (11/23/2023)   Overall Financial Resource Strain (CARDIA)    Difficulty of Paying Living Expenses: Not hard at all  Food Insecurity: No Food Insecurity (11/23/2023)   Hunger Vital Sign    Worried About Running Out of Food in the Last Year: Never true    Ran Out of Food in the Last Year:  Never true  Transportation Needs: No Transportation Needs (11/23/2023)   PRAPARE - Administrator, Civil Service (Medical): No    Lack of Transportation (Non-Medical): No  Physical Activity: Insufficiently Active (11/23/2023)   Exercise Vital Sign    Days of Exercise per Week: 3 days    Minutes of Exercise per Session: 20 min  Stress: No Stress Concern Present (11/23/2023)   [employer] of Occupational Health - Occupational Stress Questionnaire    Feeling of Stress: Only a little  Social Connections: Unknown (11/23/2023)   Social Connection and Isolation Panel    Frequency of Communication with Friends and Family: Twice a week    Frequency of Social Gatherings with Friends and Family: Once a week    Attends Religious Services: Not on Marketing executive or Organizations: No    Attends Engineer, structural: Not on  file    Marital Status: Divorced  Intimate Partner Violence: Not on file     Constitutional: Denies fever, malaise, fatigue, headache or abrupt weight changes.  HEENT: Denies eye pain, eye redness, ear pain, ringing in the ears, wax buildup, runny nose, nasal congestion, bloody nose, or sore throat. Respiratory: Denies difficulty breathing, shortness of breath, cough or sputum production.   Cardiovascular: Pt reports chest tightness (anxiety related). Denies chest pain, palpitations or swelling in the hands or feet.  Gastrointestinal: Pt reports intermittent reflux. Denies abdominal pain, bloating, constipation, diarrhea or blood in the stool.  GU: Denies urgency, frequency, pain with urination, burning sensation, blood in urine, odor or discharge. Musculoskeletal: Patient reports intermittent knee pain, left foot discomfort.  Denies decrease in range of motion, difficulty with gait, muscle pain or joint swelling.  Skin: Denies redness, rashes, lesions or ulcercations.  Neurological: Denies dizziness, difficulty with memory, difficulty with speech or problems with balance and coordination.  Psych: Patient has a history of anxiety.  Denies depression, SI/HI.  No other specific complaints in a complete review of systems (except as listed in HPI above).  Objective:   Physical Exam BP 118/72 (BP Location: Right Arm, Patient Position: Sitting, Cuff Size: Normal)   Ht 5' (1.524 m)   Wt 165 lb (74.8 kg)   BMI 32.22 kg/m    Wt Readings from Last 3 Encounters:  01/01/24 165 lb 9.6 oz (75.1 kg)  12/25/23 165 lb 9.6 oz (75.1 kg)  12/18/23 165 lb 9.6 oz (75.1 kg)    General: Appears her stated age obese, in NAD. Skin: Warm, dry and intact.  HEENT: Head: normal shape and size; Eyes: sclera white, no icterus, conjunctiva pink, PERRLA and EOMs intact;  Neck:  Neck supple, trachea midline. No masses, lumps or thyromegaly present.  Cardiovascular: Normal rate and rhythm. S1,S2 noted.  No murmur,  rubs or gallops noted. No JVD or BLE edema. No carotid bruits noted. Pulmonary/Chest: Normal effort and positive vesicular breath sounds. No respiratory distress. No wheezes, rales or ronchi noted.  Abdomen: Normal bowel sounds.  Musculoskeletal: Left foot in boot, Strength 5/5 BUE/BLE.  Using knee scooter for assistance with gait. Neurological: Alert and oriented. Cranial nerves II-XII grossly intact. Coordination normal.  Psychiatric: Mood and affect normal. Behavior is normal. Judgment and thought content normal.    BMET    Component Value Date/Time   NA 140 11/23/2023 0944   NA 138 02/03/2013 0911   K 4.5 11/23/2023 0944   K 3.8 02/03/2013 0911   CL 104 11/23/2023 0944   CL 104 02/03/2013  0911   CO2 28 11/23/2023 0944   CO2 30 02/03/2013 0911   GLUCOSE 91 11/23/2023 0944   GLUCOSE 90 02/03/2013 0911   BUN 25 11/23/2023 0944   BUN 13 02/03/2013 0911   CREATININE 0.94 11/23/2023 0944   CALCIUM  9.2 11/23/2023 0944   CALCIUM  9.1 02/03/2013 0911   GFRNONAA >60 02/03/2013 0911   GFRAA >60 02/03/2013 0911    Lipid Panel     Component Value Date/Time   CHOL 215 (H) 07/17/2023 0813   CHOL 244 (H) 02/03/2013 0911   TRIG 125 07/17/2023 0813   TRIG 199 02/03/2013 0911   HDL 61 07/17/2023 0813   HDL 56 02/03/2013 0911   CHOLHDL 3.5 07/17/2023 0813   VLDL 40 02/03/2013 0911   LDLCALC 130 (H) 07/17/2023 0813   LDLCALC 148 (H) 02/03/2013 0911    CBC    Component Value Date/Time   WBC 5.0 11/23/2023 0944   RBC 4.21 11/23/2023 0944   HGB 12.4 11/23/2023 0944   HGB 13.4 02/03/2013 0911   HCT 38.6 11/23/2023 0944   HCT 39.2 02/03/2013 0911   PLT 250 11/23/2023 0944   PLT 279 02/03/2013 0911   MCV 91.7 11/23/2023 0944   MCV 88 02/03/2013 0911   MCH 29.5 11/23/2023 0944   MCHC 32.1 11/23/2023 0944   RDW 12.9 11/23/2023 0944   RDW 12.7 02/03/2013 0911   LYMPHSABS 1.6 02/03/2013 0911   MONOABS 0.5 02/03/2013 0911   EOSABS 0.1 02/03/2013 0911   BASOSABS 0.0 02/03/2013 0911     Hgb A1C Lab Results  Component Value Date   HGBA1C 5.5 07/17/2023           Assessment & Plan:   Preventative health maintenance:  Flu shot declined Tetanus UTD Encouraged her to get her COVID booster Shingrix  UTD Pap smear UTD Mammogram ordered-she will call to schedule Colon screening UTD Encouraged her to consume a balanced diet and exercise regimen Advised her to see an eye doctor and dentist annually We will check CBC, c-Met, lipid, A1c today  RTC in 6 months, follow-up chronic conditions Angeline Laura, NP

## 2024-01-26 ENCOUNTER — Ambulatory Visit: Payer: Self-pay | Admitting: Internal Medicine

## 2024-01-26 LAB — CBC
HCT: 40.6 % (ref 35.0–45.0)
Hemoglobin: 13.4 g/dL (ref 11.7–15.5)
MCH: 30.2 pg (ref 27.0–33.0)
MCHC: 33 g/dL (ref 32.0–36.0)
MCV: 91.4 fL (ref 80.0–100.0)
MPV: 9.1 fL (ref 7.5–12.5)
Platelets: 306 Thousand/uL (ref 140–400)
RBC: 4.44 Million/uL (ref 3.80–5.10)
RDW: 12.2 % (ref 11.0–15.0)
WBC: 5.6 Thousand/uL (ref 3.8–10.8)

## 2024-01-26 LAB — HEMOGLOBIN A1C
Hgb A1c MFr Bld: 5.7 % — ABNORMAL HIGH (ref <5.7)
Mean Plasma Glucose: 117 mg/dL
eAG (mmol/L): 6.5 mmol/L

## 2024-01-26 LAB — COMPREHENSIVE METABOLIC PANEL WITH GFR
AG Ratio: 1.7 (calc) (ref 1.0–2.5)
ALT: 14 U/L (ref 6–29)
AST: 17 U/L (ref 10–35)
Albumin: 4.7 g/dL (ref 3.6–5.1)
Alkaline phosphatase (APISO): 101 U/L (ref 37–153)
BUN: 17 mg/dL (ref 7–25)
CO2: 26 mmol/L (ref 20–32)
Calcium: 9.6 mg/dL (ref 8.6–10.4)
Chloride: 103 mmol/L (ref 98–110)
Creat: 0.86 mg/dL (ref 0.50–1.03)
Globulin: 2.7 g/dL (ref 1.9–3.7)
Glucose, Bld: 102 mg/dL — ABNORMAL HIGH (ref 65–99)
Potassium: 4.4 mmol/L (ref 3.5–5.3)
Sodium: 137 mmol/L (ref 135–146)
Total Bilirubin: 0.5 mg/dL (ref 0.2–1.2)
Total Protein: 7.4 g/dL (ref 6.1–8.1)
eGFR: 79 mL/min/1.73m2 (ref >=60)

## 2024-01-26 LAB — LIPID PANEL
Cholesterol: 197 mg/dL (ref <200)
HDL: 64 mg/dL (ref >=50)
LDL Cholesterol (Calc): 106 mg/dL — ABNORMAL HIGH
Non-HDL Cholesterol (Calc): 133 mg/dL — ABNORMAL HIGH (ref <130)
Total CHOL/HDL Ratio: 3.1 (calc) (ref <5.0)
Triglycerides: 158 mg/dL — ABNORMAL HIGH (ref <150)

## 2024-01-29 ENCOUNTER — Ambulatory Visit (INDEPENDENT_AMBULATORY_CARE_PROVIDER_SITE_OTHER)

## 2024-01-29 DIAGNOSIS — Z09 Encounter for follow-up examination after completed treatment for conditions other than malignant neoplasm: Secondary | ICD-10-CM

## 2024-01-29 NOTE — Progress Notes (Signed)
   No chief complaint on file.   Subjective:  Patient presents today status post triple arthrodesis with tendo Achilles lengthening left.  DOS: 12/10/2023.  Continues to do well.  No pain.  She has been nonweightbearing in the cam boot with knee scooter as instructed.  Past Medical History:  Diagnosis Date   Hyperlipidemia    Plantar fasciitis     Past Surgical History:  Procedure Laterality Date   TUBAL LIGATION      Allergies  Allergen Reactions   Asa [Aspirin] Other (See Comments)    burning in stomach     Objective/Physical Exam Neurovascular status intact. Incisions well healed.  Significantly improved minimal edema noted to the surgical extremity.  No active bleeding or dehiscence.  The erythema around the forefoot is completely resolved  Radiographic Exam LT foot and ankle 01/29/24:  Orthopedic hardware and arthrodesis sites appear to be stable with routine healing. Good reduction of deformity. Increased consolidation compared to previous views.   Assessment: 1. s/p triple arthrodesis with tendo Achilles lengthening left. DOS: 12/10/2023   Plan of Care:  -Patient was evaluated.   -- Continue CAM boot, may begin to place weight on foot while in CAM. Begin by placing 25% of weight on foot and gradually increase as is comfortable. May use knee scooter as crutch.  -- Discussed that Physical Therapy would be beneficial in helping patient gradually transition to WBAT in CAM walker and with desensitization -Dispensed elastic ankle compression sock to help with swelling -Return to clinic 5 weeks follow-up x-ray  Prentice Ovens, DPM [facility]     [address] [place], [place] [address]                Office [phone]  Fax [phone]

## 2024-02-09 DIAGNOSIS — M25572 Pain in left ankle and joints of left foot: Secondary | ICD-10-CM | POA: Diagnosis not present

## 2024-02-11 DIAGNOSIS — M25572 Pain in left ankle and joints of left foot: Secondary | ICD-10-CM | POA: Diagnosis not present

## 2024-02-15 ENCOUNTER — Other Ambulatory Visit: Payer: Self-pay | Admitting: Internal Medicine

## 2024-02-16 DIAGNOSIS — M25572 Pain in left ankle and joints of left foot: Secondary | ICD-10-CM | POA: Diagnosis not present

## 2024-02-16 NOTE — Telephone Encounter (Signed)
 Requested Prescriptions  Pending Prescriptions Disp Refills   lisinopril  (ZESTRIL ) 10 MG tablet [Pharmacy Med Name: LISINOPRIL  10 MG TABLET] 90 tablet 1    Sig: TAKE 1 TABLET BY MOUTH EVERY DAY     Cardiovascular:  ACE Inhibitors Passed - 02/16/2024  3:40 PM      Passed - Cr in normal range and within 180 days    Creat  Date Value Ref Range Status  01/25/2024 0.86 0.50 - 1.03 mg/dL Final         Passed - K in normal range and within 180 days    Potassium  Date Value Ref Range Status  01/25/2024 4.4 3.5 - 5.3 mmol/L Final  02/03/2013 3.8 3.5 - 5.1 mmol/L Final         Passed - Patient is not pregnant      Passed - Last BP in normal range    BP Readings from Last 1 Encounters:  01/25/24 118/72         Passed - Valid encounter within last 6 months    Recent Outpatient Visits           3 weeks ago Encounter for general adult medical examination with abnormal findings   [facility] [place], [place], NP   2 months ago Nausea and vomiting, unspecified vomiting type   [facility] [place], Angeline ORN, NP   7 months ago Prediabetes   [facility] [place], Angeline ORN, [place]

## 2024-02-19 ENCOUNTER — Encounter: Payer: Self-pay | Admitting: Podiatry

## 2024-02-19 DIAGNOSIS — M25572 Pain in left ankle and joints of left foot: Secondary | ICD-10-CM | POA: Diagnosis not present

## 2024-02-23 DIAGNOSIS — M25572 Pain in left ankle and joints of left foot: Secondary | ICD-10-CM | POA: Diagnosis not present

## 2024-02-23 MED ORDER — ATORVASTATIN CALCIUM 20 MG PO TABS: 20.0000 mg | ORAL_TABLET | Freq: Every day | ORAL | 1 refills | Status: AC

## 2024-02-26 DIAGNOSIS — M25572 Pain in left ankle and joints of left foot: Secondary | ICD-10-CM | POA: Diagnosis not present

## 2024-02-29 ENCOUNTER — Encounter: Payer: Self-pay | Admitting: Podiatry

## 2024-03-01 ENCOUNTER — Other Ambulatory Visit: Payer: Self-pay | Admitting: Internal Medicine

## 2024-03-02 NOTE — Telephone Encounter (Signed)
 Discontinued on 01/25/24.  Requested Prescriptions  Pending Prescriptions Disp Refills   busPIRone  (BUSPAR ) 5 MG tablet [Pharmacy Med Name: BUSPIRONE  HCL 5 MG TABLET] 90 tablet 3    Sig: TAKE 1 TABLET BY MOUTH DAILY AS NEEDED.     Psychiatry: Anxiolytics/Hypnotics - Non-controlled Passed - 03/02/2024 12:40 PM      Passed - Valid encounter within last 12 months    Recent Outpatient Visits           1 month ago Encounter for general adult medical examination with abnormal findings   [facility] [place], [place], NP   3 months ago Nausea and vomiting, unspecified vomiting type   [facility] [place], Angeline ORN, NP   7 months ago Prediabetes   [facility] [place], Angeline ORN, [place]

## 2024-03-04 ENCOUNTER — Ambulatory Visit (INDEPENDENT_AMBULATORY_CARE_PROVIDER_SITE_OTHER)

## 2024-03-04 ENCOUNTER — Encounter: Payer: Self-pay | Admitting: Podiatry

## 2024-03-04 ENCOUNTER — Ambulatory Visit

## 2024-03-04 ENCOUNTER — Ambulatory Visit (INDEPENDENT_AMBULATORY_CARE_PROVIDER_SITE_OTHER): Admitting: Podiatry

## 2024-03-04 VITALS — Ht 60.0 in | Wt 165.0 lb

## 2024-03-04 DIAGNOSIS — M62461 Contracture of muscle, right lower leg: Secondary | ICD-10-CM | POA: Diagnosis not present

## 2024-03-04 DIAGNOSIS — M25572 Pain in left ankle and joints of left foot: Secondary | ICD-10-CM | POA: Diagnosis not present

## 2024-03-04 DIAGNOSIS — M62462 Contracture of muscle, left lower leg: Secondary | ICD-10-CM | POA: Diagnosis not present

## 2024-03-04 DIAGNOSIS — M76821 Posterior tibial tendinitis, right leg: Secondary | ICD-10-CM | POA: Diagnosis not present

## 2024-03-04 DIAGNOSIS — M2011 Hallux valgus (acquired), right foot: Secondary | ICD-10-CM | POA: Diagnosis not present

## 2024-03-04 NOTE — Progress Notes (Signed)
 Chief Complaint  Patient presents with   Routine Post Op    Post Op DOS 12/10/2023--LT TRIPLE ARTHRODESIS, LT TENDOACHILLES LENGTHENING, pt states everything is going well getting better everyday.     Subjective:  Patient presents today status post triple arthrodesis with tendo Achilles lengthening left.  DOS: 12/10/2023.  Continues to do well.  No pain.  She has been weightbearing in the cam boot intermittently.  She has also been going to physical therapy and there has been steady improvement.  Left foot is doing very well  Long history of right foot pain as well.  She has flatfoot deformity to the right foot with pain and tenderness as well as a bunion that is very symptomatic.  This has been ongoing for several years and the initial plan was to have left foot surgery first followed by right foot surgery.  I believe that she is in a position to pursue right foot surgery and she presents today for surgical consult  Past Medical History:  Diagnosis Date   Hyperlipidemia    Plantar fasciitis     Past Surgical History:  Procedure Laterality Date   TUBAL LIGATION      Allergies  Allergen Reactions   Asa [Aspirin] Other (See Comments)    burning in stomach     Objective/Physical Exam Neurovascular status intact.  Incision is nicely healed.  There is some mild postoperative edema but overall significant improvement.  With weightbearing the foot is in rectus alignment with correction of the heel valgus deformity.    With weightbearing of the right foot there is collapse of the medial longitudinal arch of the foot and rear foot valgus deformity.  Bunion deformity also noted to the right foot with associated tenderness.  Chronic pain associated with weightbearing to the right lower extremity despite custom orthotics and shoe gear modifications.  Radiographic Exam LT foot and ankle 02/23/2024:  Orthopedic hardware and arthrodesis sites appear to be stable with routine healing.  Osseous  union across the arthrodesis sites.  Orthopedic hardware is intact and stable.  Assessment: 1. s/p triple arthrodesis with tendo Achilles lengthening left. DOS: 12/10/2023 3.  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  Gastroc equinus right lower extremity 5.  Hallux valgus deformity right   Plan of Care:  -Patient was evaluated.  X-rays reviewed - Okay to discontinue the cam boot left lower extremity.  Resume good supportive tennis shoes and sneakers with arch supports. -She is also okay to discontinue physical therapy.  She has been doing exercises at home routinely without complication -Overall she is very satisfied with surgical outcome left lower extremity.  Unfortunately she continues to have pain and tenderness associated to the right lower extremity. -Today we discussed the same procedure to the right lower extremity that we did to the left including triple arthrodesis with tendo Achilles lengthening.  In addition to these procedures we will proceed with bunionectomy with distal first metatarsal osteotomy of the right foot as well.  Risk benefits advantages and disadvantages of the procedure as well as postoperative recovery was explained in length in detail.  No guarantees were expressed or implied.  All patient questions answered. -Authorization for surgery was initiated today.  Surgery will consist of triple arthrodesis right.  Tendo Achilles lengthening right.  Bunionectomy with distal first metatarsal osteotomy right. -Patient will need to continue to refrain from work to allow the left foot to continue to improve as well as right lower extremity postoperative recovery.  Anticipated return to work date end of February 2026. -Return to clinic 1 week postop  Thresa EMERSON Sar, DPM [facility]  Dr. Thresa EMERSON Sar, DPM    [address] [place], [place] [address]                Office [phone]  Fax [phone]

## 2024-03-07 ENCOUNTER — Ambulatory Visit
Admission: RE | Admit: 2024-03-07 | Discharge: 2024-03-07 | Disposition: A | Discharge status: Home / Self Care | Source: Ambulatory Visit | Attending: Internal Medicine | Admitting: Internal Medicine

## 2024-03-07 DIAGNOSIS — Z1231 Encounter for screening mammogram for malignant neoplasm of breast: Secondary | ICD-10-CM | POA: Diagnosis not present

## 2024-03-09 ENCOUNTER — Encounter: Payer: Self-pay | Admitting: Internal Medicine

## 2024-03-09 ENCOUNTER — Ambulatory Visit: Admitting: Internal Medicine

## 2024-03-09 VITALS — BP 124/72 | Ht 60.0 in | Wt 172.0 lb

## 2024-03-09 DIAGNOSIS — J358 Other chronic diseases of tonsils and adenoids: Secondary | ICD-10-CM | POA: Diagnosis not present

## 2024-03-09 LAB — POCT RAPID STREP A (OFFICE): Rapid Strep A Screen: NEGATIVE

## 2024-03-09 NOTE — Progress Notes (Signed)
 Subjective:    Patient ID: Diana Rose, female    DOB: 1965-12-07, 58 y.o.   MRN: [id number]  HPI  Discussed the use of AI scribe software for clinical note transcription with the patient, who gave verbal consent to proceed.   Diana Rose is a 58 year old female who presents with a white spot on her tonsil.  She noticed the white spot on her tonsil on Monday. There are no associated symptoms such as sore throat, headache, rhinorrhea, nasal congestion, cough, or dyspnea. She experiences occasional sneezing, which she considers normal.  She is concerned about the spot due to an upcoming surgery on her right foot, which she needs to complete before Thanksgiving to return to work by March. She emphasizes the importance of ensuring there are no complications before the surgery.       Review of Systems     Past Medical History:  Diagnosis Date   Hyperlipidemia    Plantar fasciitis     Current Outpatient Medications  Medication Sig Dispense Refill   atorvastatin  (LIPITOR) 20 MG tablet Take 1 tablet (20 mg total) by mouth daily. 90 tablet 1   fluticasone  (FLONASE ) 50 MCG/ACT nasal spray Place 1 spray into both nostrils daily. 16 g 0   ibuprofen  (ADVIL ) 800 MG tablet Take 1 tablet (800 mg total) by mouth 3 (three) times daily. 60 tablet 1   lisinopril  (ZESTRIL ) 10 MG tablet TAKE 1 TABLET BY MOUTH EVERY DAY 90 tablet 1   oxyCODONE -acetaminophen  (PERCOCET) 10-325 MG tablet Take 1 tablet by mouth every 4 (four) hours as needed for pain. 30 tablet 0   Probiotic Product (PROBIOTIC BLEND PO) Take by mouth.     triamcinolone  cream (KENALOG ) 0.1 % APPLY TOPICALLY TWICE A DAY 30 g 0   No current facility-administered medications for this visit.    Allergies  Allergen Reactions   Asa [Aspirin] Other (See Comments)    burning in stomach     Family History  Problem Relation Age of Onset   Fibromyalgia Mother    Heart attack Mother        104s   Hypertension Mother     Congestive Heart Failure Mother    Other Father        unknown medical history   Bipolar disorder Sister     Social History   Socioeconomic History   Marital status: Legally Separated    Spouse name: Not on file   Number of children: Not on file   Years of education: Not on file   Highest education level: 12th grade  Occupational History   Not on file  Tobacco Use   Smoking status: Former    Types: Cigarettes   Smokeless tobacco: Never  Vaping Use   Vaping status: Never Used  Substance and Sexual Activity   Alcohol use: Yes    Comment: occassional   Drug use: Never   Sexual activity: Not on file  Other Topics Concern   Not on file  Social History Narrative   Not on file   Social Drivers of Health   Financial Resource Strain: Low Risk  (11/23/2023)   Overall Financial Resource Strain (CARDIA)    Difficulty of Paying Living Expenses: Not hard at all  Food Insecurity: No Food Insecurity (11/23/2023)   Hunger Vital Sign    Worried About Running Out of Food in the Last Year: Never true    Ran Out of Food in the Last Year: Never true  Transportation Needs: No Transportation Needs (11/23/2023)   PRAPARE - Administrator, Civil Service (Medical): No    Lack of Transportation (Non-Medical): No  Physical Activity: Insufficiently Active (11/23/2023)   Exercise Vital Sign    Days of Exercise per Week: 3 days    Minutes of Exercise per Session: 20 min  Stress: No Stress Concern Present (11/23/2023)   [employer] of Occupational Health - Occupational Stress Questionnaire    Feeling of Stress: Only a little  Social Connections: Unknown (11/23/2023)   Social Connection and Isolation Panel    Frequency of Communication with Friends and Family: Twice a week    Frequency of Social Gatherings with Friends and Family: Once a week    Attends Religious Services: Not on Marketing Executive or Organizations: No    Attends Engineer, Structural: Not on  file    Marital Status: Divorced  Intimate Partner Violence: Not on file     Constitutional: Denies fever, malaise, fatigue, headache or abrupt weight changes.  HEENT: Patient reports white spot on tonsil.  Denies eye pain, eye redness, ear pain, ringing in the ears, wax buildup, runny nose, nasal congestion, bloody nose, or sore throat. Respiratory: Denies difficulty breathing, shortness of breath, cough or sputum production.   Cardiovascular: Pt reports chest tightness (anxiety related). Denies chest pain, palpitations or swelling in the hands or feet.  Skin: Denies redness, rashes, lesions or ulcercations.  Neurological: Denies dizziness, difficulty with memory, difficulty with speech or problems with balance and coordination.   No other specific complaints in a complete review of systems (except as listed in HPI above).  Objective:  BP 124/72 (BP Location: Left Arm, Patient Position: Sitting, Cuff Size: Normal)   Ht 5' (1.524 m)   Wt 172 lb (78 kg)   BMI 33.59 kg/m     Wt Readings from Last 3 Encounters:  03/04/24 165 lb (74.8 kg)  01/25/24 165 lb (74.8 kg)  01/01/24 165 lb 9.6 oz (75.1 kg)    General: Appears her stated age obese, in NAD. Skin: Warm, dry and intact.  HEENT: Head: normal shape and size; Eyes: sclera white, no icterus, conjunctiva pink, PERRLA and EOMs intact; throat: Mucosa pink and moist, tonsil stone noted in the left tonsillar pillar. Neck: No adenopathy noted. Cardiovascular: Normal rate and rhythm. S1,S2 noted.  No murmur, rubs or gallops noted.  Pulmonary/Chest: Normal effort and positive vesicular breath sounds. No respiratory distress. No wheezes, rales or ronchi noted.  Neurological: Alert and oriented.   BMET    Component Value Date/Time   NA 137 01/25/2024 0827   NA 138 02/03/2013 0911   K 4.4 01/25/2024 0827   K 3.8 02/03/2013 0911   CL 103 01/25/2024 0827   CL 104 02/03/2013 0911   CO2 26 01/25/2024 0827   CO2 30 02/03/2013 0911    GLUCOSE 102 (H) 01/25/2024 0827   GLUCOSE 90 02/03/2013 0911   BUN 17 01/25/2024 0827   BUN 13 02/03/2013 0911   CREATININE 0.86 01/25/2024 0827   CALCIUM  9.6 01/25/2024 0827   CALCIUM  9.1 02/03/2013 0911   GFRNONAA >60 02/03/2013 0911   GFRAA >60 02/03/2013 0911    Lipid Panel     Component Value Date/Time   CHOL 197 01/25/2024 0827   CHOL 244 (H) 02/03/2013 0911   TRIG 158 (H) 01/25/2024 0827   TRIG 199 02/03/2013 0911   HDL 64 01/25/2024 0827   HDL 56 02/03/2013 0911  CHOLHDL 3.1 01/25/2024 0827   VLDL 40 02/03/2013 0911   LDLCALC 106 (H) 01/25/2024 0827   LDLCALC 148 (H) 02/03/2013 0911    CBC    Component Value Date/Time   WBC 5.6 01/25/2024 0827   RBC 4.44 01/25/2024 0827   HGB 13.4 01/25/2024 0827   HGB 13.4 02/03/2013 0911   HCT 40.6 01/25/2024 0827   HCT 39.2 02/03/2013 0911   PLT 306 01/25/2024 0827   PLT 279 02/03/2013 0911   MCV 91.4 01/25/2024 0827   MCV 88 02/03/2013 0911   MCH 30.2 01/25/2024 0827   MCHC 33.0 01/25/2024 0827   RDW 12.2 01/25/2024 0827   RDW 12.7 02/03/2013 0911   LYMPHSABS 1.6 02/03/2013 0911   MONOABS 0.5 02/03/2013 0911   EOSABS 0.1 02/03/2013 0911   BASOSABS 0.0 02/03/2013 0911    Hgb A1C Lab Results  Component Value Date   HGBA1C 5.7 (H) 01/25/2024           Assessment & Plan:  Assessment and Plan    Tonsil stone Asymptomatic tonsil stone confirmed. Negative strep test. - Reassured her that the tonsil stone is not a concern and likely to resolve spontaneously. - Advised saline gargles to aid in dislodging the stone.       RTC in 4 months, follow-up chronic conditions Angeline Laura, NP

## 2024-03-10 ENCOUNTER — Encounter: Payer: Self-pay | Admitting: Podiatry

## 2024-03-12 ENCOUNTER — Encounter: Payer: Self-pay | Admitting: Internal Medicine

## 2024-03-14 MED ORDER — MELOXICAM 15 MG PO TABS: 15.0000 mg | ORAL_TABLET | Freq: Every day | ORAL | 0 refills | Status: DC

## 2024-03-15 ENCOUNTER — Encounter: Payer: Self-pay | Admitting: Podiatry

## 2024-03-16 ENCOUNTER — Telehealth: Payer: Self-pay | Admitting: Podiatry

## 2024-03-16 ENCOUNTER — Encounter: Payer: Self-pay | Admitting: Podiatry

## 2024-03-16 NOTE — Telephone Encounter (Signed)
 Called and scheduled patient for surgery on 03/24/2024. Patient pharmacy correct in chart. Patient not on any blood thinners or GLP1 medications. Patient is aware [facility] will call 24-48 hours prior to surgery with arrival time

## 2024-03-16 NOTE — Telephone Encounter (Signed)
 DOS- 03/24/2024 (WILL BE DONE AT [facility])  AUSTIN BUNIONECTOMY RT- 28296 TENDO-ACHILLES LENGTH RT- 27685 TRIPLE ARTHRODESIS RT- [id number]  [employer]BS EFFECTIVE DATE- 05/06/2023  DEDUCTIBLE- $1650 REMAINING- $0 OOP- $4000 REMAINING- $0 COINSURANCE- 20%  PER BCBS PORTAL, NO PRIOR AUTHS ARE REQUIRED FOR CPT CODES 71703, [id number], AND [id number]. DOCUMENTATION ATTACHED TO SURGERY CONSENT PACKET.

## 2024-03-24 ENCOUNTER — Other Ambulatory Visit: Payer: Self-pay | Admitting: Podiatry

## 2024-03-24 DIAGNOSIS — M2141 Flat foot [pes planus] (acquired), right foot: Secondary | ICD-10-CM | POA: Diagnosis not present

## 2024-03-24 DIAGNOSIS — M76821 Posterior tibial tendinitis, right leg: Secondary | ICD-10-CM | POA: Diagnosis not present

## 2024-03-24 DIAGNOSIS — M2011 Hallux valgus (acquired), right foot: Secondary | ICD-10-CM | POA: Diagnosis not present

## 2024-03-24 DIAGNOSIS — M19071 Primary osteoarthritis, right ankle and foot: Secondary | ICD-10-CM | POA: Diagnosis not present

## 2024-03-24 DIAGNOSIS — M216X1 Other acquired deformities of right foot: Secondary | ICD-10-CM | POA: Diagnosis not present

## 2024-03-24 DIAGNOSIS — M62461 Contracture of muscle, right lower leg: Secondary | ICD-10-CM | POA: Diagnosis not present

## 2024-03-24 DIAGNOSIS — G8918 Other acute postprocedural pain: Secondary | ICD-10-CM | POA: Diagnosis not present

## 2024-03-24 MED ORDER — OXYCODONE-ACETAMINOPHEN 10-325 MG PO TABS: 1.0000 | ORAL_TABLET | ORAL | 0 refills | Status: DC | PRN

## 2024-03-24 MED ORDER — IBUPROFEN 800 MG PO TABS: 800.0000 mg | ORAL_TABLET | Freq: Three times a day (TID) | ORAL | 1 refills | Status: AC

## 2024-03-24 NOTE — Progress Notes (Signed)
 PRN postop

## 2024-03-30 ENCOUNTER — Ambulatory Visit (INDEPENDENT_AMBULATORY_CARE_PROVIDER_SITE_OTHER)

## 2024-03-30 ENCOUNTER — Encounter: Admitting: Podiatry

## 2024-03-30 DIAGNOSIS — M62462 Contracture of muscle, left lower leg: Secondary | ICD-10-CM

## 2024-03-30 DIAGNOSIS — M2011 Hallux valgus (acquired), right foot: Secondary | ICD-10-CM

## 2024-03-30 DIAGNOSIS — M76821 Posterior tibial tendinitis, right leg: Secondary | ICD-10-CM

## 2024-03-30 DIAGNOSIS — M2142 Flat foot [pes planus] (acquired), left foot: Secondary | ICD-10-CM

## 2024-03-30 DIAGNOSIS — Z09 Encounter for follow-up examination after completed treatment for conditions other than malignant neoplasm: Secondary | ICD-10-CM

## 2024-03-30 DIAGNOSIS — M62461 Contracture of muscle, right lower leg: Secondary | ICD-10-CM

## 2024-03-30 NOTE — Progress Notes (Signed)
 Subjective:  Patient ID: Diana Rose, female    DOB: [birth year],  MRN: [id number]  Chief Complaint  Patient presents with   Post-op Problem    Rm2 POV #1 DOS 03/24/2024 RT AUSTIN BUNIONECTOMY, RT TENDO ACHILLES LENGTH, RT TRIPLE ARTHRODESIS (EVANS)/ patient says she has some sharp pains but she is doing well.     DOS: 03/24/24 Procedure: Right triple arthrodesis, tendoachilles lengthening, austin bunionectomy  58 y.o. female returns for post-op check.  She has 1 week status post right triple arthrodesis, tendo Achilles lengthening, Austin bunionectomy.  She is doing quite well in her recovery.  She relates to occasional sharp pains.  She has only taken one of her prescribed pain medications. She had a reconstruction on her left foot 12/10/23 and is doing very well.   Review of Systems: Negative except as noted in the HPI. Denies N/V/F/Ch.  Past Medical History:  Diagnosis Date   Hyperlipidemia    Plantar fasciitis     Current Outpatient Medications:    atorvastatin  (LIPITOR) 20 MG tablet, Take 1 tablet (20 mg total) by mouth daily., Disp: 90 tablet, Rfl: 1   fluticasone  (FLONASE ) 50 MCG/ACT nasal spray, Place 1 spray into both nostrils daily., Disp: 16 g, Rfl: 0   ibuprofen  (ADVIL ) 800 MG tablet, Take 1 tablet (800 mg total) by mouth 3 (three) times daily., Disp: 60 tablet, Rfl: 1   lisinopril  (ZESTRIL ) 10 MG tablet, TAKE 1 TABLET BY MOUTH EVERY DAY, Disp: 90 tablet, Rfl: 1   meloxicam  (MOBIC ) 15 MG tablet, Take 1 tablet (15 mg total) by mouth daily., Disp: 90 tablet, Rfl: 0   oxyCODONE -acetaminophen  (PERCOCET) 10-325 MG tablet, Take 1 tablet by mouth every 4 (four) hours as needed for pain., Disp: 30 tablet, Rfl: 0   Probiotic Product (PROBIOTIC BLEND PO), Take by mouth., Disp: , Rfl:    triamcinolone  cream (KENALOG ) 0.1 %, APPLY TOPICALLY TWICE A DAY, Disp: 30 g, Rfl: 0  Social History   Tobacco Use  Smoking Status Former   Types: Cigarettes  Smokeless Tobacco Never     Allergies  Allergen Reactions   Asa [Aspirin] Other (See Comments)    burning in stomach    Objective:  There were no vitals filed for this visit. There is no height or weight on file to calculate BMI. Constitutional Well developed. Well nourished.  Vascular Foot warm and well perfused. Capillary refill normal to all digits.   Neurologic Normal speech. Oriented to person, place, and time. Epicritic sensation to light touch grossly present bilaterally.  Dermatologic Skin healing well without signs of infection. Skin edges well coapted without signs of infection. Mild edema around surgical site. Minimal serous drainage. Staples intact, sutures intact posteriorly. Ecchymosis present.   Orthopedic: Mild tenderness to palpation noted about the surgical site within normal limits.   Radiographs: 5 views of the right foot and ankle were acquired today.  These show excellent correction of deformity, fusion site apposition, and stable hardware in appropriate position.  No complications are identified.  3 views of the left foot were taken today.  These show continued consolidation across the arthrodesis sites.  Hardware appears stable.  Rectus alignment. Assessment:   1. Gastrocnemius equinus, right   2. Gastrocnemius equinus, left   3. Posterior tibial tendon dysfunction, right   4. Surgery follow-up   5. Pes planovalgus, acquired, left   6. Hallux valgus, right    Plan:  Patient was evaluated and treated and all questions answered. Doing well in post  operative recovery.   S/p Right foot and ankle surgery  -Progressing as expected post-operatively. -XR: Above, look good -WB Status: Non weight bearing in CAM walker. -Sutures/staples: intact, will be removed at next post-op visit. -Medications: Utilize pain medication as needed.  Continue previous medications as discussed with Dr. Janit.  No additional medications required today. -Foot redressed. -XR at next visit: not  needed  F/u with Dr. Janit Prentice Ovens, DPM

## 2024-04-08 ENCOUNTER — Ambulatory Visit (INDEPENDENT_AMBULATORY_CARE_PROVIDER_SITE_OTHER): Admitting: Podiatry

## 2024-04-08 ENCOUNTER — Encounter: Payer: Self-pay | Admitting: Podiatry

## 2024-04-08 VITALS — Ht 60.0 in | Wt 172.0 lb

## 2024-04-08 DIAGNOSIS — M76821 Posterior tibial tendinitis, right leg: Secondary | ICD-10-CM

## 2024-04-08 NOTE — Progress Notes (Signed)
   Chief Complaint  Patient presents with   Routine Post Op    POV #2 DOS 03/24/2024 RT AUSTIN BUNIONECTOMY, RT TENDO ACHILLES LENGTH, RT TRIPLE ARTHRODESIS, she states everything is going well, ready to get staple out so she can shower.    Subjective:  Patient presents today status post triple arthrodesis, TAL, Austin bunionectomy RT foot.  DOS: 03/24/2024.  Doing well.  Past Medical History:  Diagnosis Date   Hyperlipidemia    Plantar fasciitis     Past Surgical History:  Procedure Laterality Date   TUBAL LIGATION      Allergies  Allergen Reactions   Asa [Aspirin] Other (See Comments)    burning in stomach     Objective/Physical Exam Neurovascular status intact.  Incision well coapted with sutures and staples intact. No sign of infectious process noted. No dehiscence. No active bleeding noted.  Minimal edema noted to the surgical extremity.  Radiographic Exam RT foot and ankle 03/30/2024:  Orthopedic hardware and osteotomies sites appear to be stable with routine healing.  Assessment: 1. s/p triple arthrodesis, TAL, Austin bunionectomy RT. DOS: 03/24/2024   Plan of Care:  -Patient was evaluated. X-rays reviewed that were taken 03/30/2024 - Sutures and staples removed today -Okay to begin washing and showering getting the foot wet beginning next week -Continue NWB in the cam boot using the knee scooter -Return to clinic 6 weeks follow-up x-ray and to initiate weightbearing and physical therapy  *Anticipated return to work date end of February 2026. 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 Thresa EMERSON Sar, DPM    [address] [place], [place] [address]                Office [phone]  Fax [phone]

## 2024-04-18 ENCOUNTER — Encounter: Payer: Self-pay | Admitting: Podiatry

## 2024-04-22 ENCOUNTER — Encounter: Admitting: Podiatry

## 2024-04-24 ENCOUNTER — Encounter: Payer: Self-pay | Admitting: Podiatry

## 2024-05-13 ENCOUNTER — Ambulatory Visit

## 2024-05-13 ENCOUNTER — Encounter: Payer: Self-pay | Admitting: Podiatry

## 2024-05-13 ENCOUNTER — Ambulatory Visit: Admitting: Podiatry

## 2024-05-13 VITALS — Ht 60.0 in | Wt 172.0 lb

## 2024-05-13 DIAGNOSIS — M62461 Contracture of muscle, right lower leg: Secondary | ICD-10-CM

## 2024-05-13 DIAGNOSIS — M62462 Contracture of muscle, left lower leg: Secondary | ICD-10-CM

## 2024-05-13 NOTE — Progress Notes (Signed)
" ° °  No chief complaint on file.   Subjective:  Patient presents today status post triple arthrodesis, TAL, Austin bunionectomy RT foot.  DOS: 03/24/2024.  Doing well.  This past week she discontinued wearing the cam boot and has been wearing tennis shoes.  She presents today in tennis shoes and a cane although this was advised against last visit  Past Medical History:  Diagnosis Date   Hyperlipidemia    Plantar fasciitis     Past Surgical History:  Procedure Laterality Date   TUBAL LIGATION      Allergies  Allergen Reactions   Asa [Aspirin] Other (See Comments)    burning in stomach     Objective/Physical Exam Neurovascular status intact.  Incisions are nicely healed.  There continues to be some moderate edema noted to the right lower extremity.  Good range of motion.  No impingement to the anterior ankle.  Radiographic Exam RT foot and ankle 05/13/2024:  Orthopedic hardware and osteotomies sites appear to be stable with routine healing.  Radiographic exam LT foot and ankle 05/13/2024: Orthopedic hardware is intact with good arthrodesis throughout the midtarsal bones.  Well-healing surgical foot  Assessment: 1. s/p triple arthrodesis, TAL, Austin bunionectomy RT. DOS: 03/24/2024 2.  H/o triple arthrodesis with tendo Achilles lengthening LT. DOS: 12/10/2023.    Plan of Care:  -Patient was evaluated.  X-rays reviewed -Patient stated that she needs to return to work.  Unfortunately her disability has ended and she is no longer receiving income.  Fortunately her work is mostly sitting. -Note for work was provided today: Return to work beginning 05/16/2024 w/ restrictions.  Rest as needed.  No periods of long standing or extensive walking -Okay to transition out of the cam boot into good supportive tennis shoes and sneakers. -Return to clinic 6 weeks follow-up x-ray RT foot and ankle  *Anticipated return to work date end of February 2026. *Works for a science writer; sitting mostly  Thresa EMERSON Sar, DPM [facility]  Dr. Thresa EMERSON Sar, DPM    [address] [place], [place] [address]                Office [phone]  Fax [phone]         "

## 2024-05-16 ENCOUNTER — Encounter: Payer: Self-pay | Admitting: Podiatry

## 2024-05-16 DIAGNOSIS — Z0279 Encounter for issue of other medical certificate: Secondary | ICD-10-CM

## 2024-05-16 NOTE — Telephone Encounter (Signed)
 Emailed pt and faxed [employer] 800 [id number] with revised RTW 05/30/24 and will email pt to have for her records.

## 2024-05-17 ENCOUNTER — Ambulatory Visit: Admitting: Podiatry

## 2024-05-27 ENCOUNTER — Encounter: Admitting: Podiatry

## 2024-06-09 ENCOUNTER — Other Ambulatory Visit: Payer: Self-pay | Admitting: Internal Medicine

## 2024-06-09 ENCOUNTER — Encounter: Payer: Self-pay | Admitting: Podiatry

## 2024-06-10 NOTE — Telephone Encounter (Signed)
 Requested Prescriptions  Pending Prescriptions Disp Refills   meloxicam  (MOBIC ) 15 MG tablet [Pharmacy Med Name: MELOXICAM  15 MG TABLET] 90 tablet 1    Sig: TAKE 1 TABLET (15 MG TOTAL) BY MOUTH DAILY.     Analgesics:  COX2 Inhibitors Failed - 06/10/2024  2:21 PM      Failed - Manual Review: Labs are only required if the patient has taken medication for more than 8 weeks.      Passed - HGB in normal range and within 360 days    Hemoglobin  Date Value Ref Range Status  01/25/2024 13.4 11.7 - 15.5 g/dL Final   HGB  Date Value Ref Range Status  02/03/2013 13.4 12.0 - 16.0 g/dL Final         Passed - Cr in normal range and within 360 days    Creat  Date Value Ref Range Status  01/25/2024 0.86 0.50 - 1.03 mg/dL Final         Passed - HCT in normal range and within 360 days    HCT  Date Value Ref Range Status  01/25/2024 40.6 35.0 - 45.0 % Final  02/03/2013 39.2 35.0 - 47.0 % Final         Passed - AST in normal range and within 360 days    AST  Date Value Ref Range Status  01/25/2024 17 10 - 35 U/L Final   SGOT(AST)  Date Value Ref Range Status  02/03/2013 24 15 - 37 Unit/L Final         Passed - ALT in normal range and within 360 days    ALT  Date Value Ref Range Status  01/25/2024 14 6 - 29 U/L Final   SGPT (ALT)  Date Value Ref Range Status  02/03/2013 22 12 - 78 U/L Final         Passed - eGFR is 30 or above and within 360 days    EGFR (African American)  Date Value Ref Range Status  02/03/2013 >60  Final   EGFR (Non-African Amer.)  Date Value Ref Range Status  02/03/2013 >60  Final    Comment:    eGFR values <13mL/min/1.73 m2 may be an indication of chronic kidney disease (CKD). Calculated eGFR is useful in patients with stable renal function. The eGFR calculation will not be reliable in acutely ill patients when serum creatinine is changing rapidly. It is not useful in  patients on dialysis. The eGFR calculation may not be applicable to patients at the  low and high extremes of body sizes, pregnant women, and vegetarians.    eGFR  Date Value Ref Range Status  01/25/2024 79 > OR = 60 mL/min/1.62m2 Final         Passed - Patient is not pregnant      Passed - Valid encounter within last 12 months    Recent Outpatient Visits           3 months ago Tonsillar exudate   [facility] [place], Angeline ORN, NP   4 months ago Encounter for general adult medical examination with abnormal findings   [facility] [facility] [place], Angeline ORN, NP   6 months ago Nausea and vomiting, unspecified vomiting type   [facility] [place], Angeline ORN, NP   10 months ago Prediabetes   [facility] [place], Angeline ORN, [place]

## 2024-06-24 ENCOUNTER — Ambulatory Visit: Admitting: Podiatry

## 2024-07-25 ENCOUNTER — Ambulatory Visit: Admitting: Internal Medicine
